# Patient Record
Sex: Female | Born: 1940
Health system: Southern US, Community
[De-identification: ages and names within clinical notes are randomized; demographics above are authoritative.]

## PROBLEM LIST (undated history)

## (undated) DIAGNOSIS — K219 Gastro-esophageal reflux disease without esophagitis: Secondary | ICD-10-CM

## (undated) DIAGNOSIS — I10 Essential (primary) hypertension: Secondary | ICD-10-CM

## (undated) DIAGNOSIS — M316 Other giant cell arteritis: Secondary | ICD-10-CM

## (undated) DIAGNOSIS — E785 Hyperlipidemia, unspecified: Secondary | ICD-10-CM

## (undated) DIAGNOSIS — B029 Zoster without complications: Secondary | ICD-10-CM

## (undated) DIAGNOSIS — M1990 Unspecified osteoarthritis, unspecified site: Secondary | ICD-10-CM

## (undated) DIAGNOSIS — M810 Age-related osteoporosis without current pathological fracture: Secondary | ICD-10-CM

## (undated) HISTORY — DX: Zoster without complications: B02.9

## (undated) HISTORY — DX: Age-related osteoporosis without current pathological fracture: M81.0

## (undated) HISTORY — DX: Gastro-esophageal reflux disease without esophagitis: K21.9

## (undated) HISTORY — DX: Other giant cell arteritis: M31.6

## (undated) HISTORY — PX: BREAST EXCISIONAL BIOPSY: SUR124

---

## 1976-04-19 HISTORY — PX: ABDOMINAL HYSTERECTOMY: SHX81

## 2007-04-20 HISTORY — PX: BREAST EXCISIONAL BIOPSY: SUR124

## 2011-09-02 ENCOUNTER — Emergency Department (HOSPITAL_COMMUNITY)
Admission: EM | Admit: 2011-09-02 | Discharge: 2011-09-02 | Disposition: A | Discharge status: Home / Self Care | Payer: Medicare Other | Attending: Emergency Medicine | Admitting: Emergency Medicine

## 2011-09-02 ENCOUNTER — Encounter (HOSPITAL_COMMUNITY): Payer: Self-pay | Admitting: Physical Medicine and Rehabilitation

## 2011-09-02 DIAGNOSIS — Z8739 Personal history of other diseases of the musculoskeletal system and connective tissue: Secondary | ICD-10-CM | POA: Insufficient documentation

## 2011-09-02 DIAGNOSIS — I10 Essential (primary) hypertension: Secondary | ICD-10-CM | POA: Insufficient documentation

## 2011-09-02 DIAGNOSIS — I160 Hypertensive urgency: Secondary | ICD-10-CM

## 2011-09-02 DIAGNOSIS — E785 Hyperlipidemia, unspecified: Secondary | ICD-10-CM | POA: Insufficient documentation

## 2011-09-02 DIAGNOSIS — R51 Headache: Secondary | ICD-10-CM

## 2011-09-02 DIAGNOSIS — Z79899 Other long term (current) drug therapy: Secondary | ICD-10-CM | POA: Insufficient documentation

## 2011-09-02 HISTORY — DX: Essential (primary) hypertension: I10

## 2011-09-02 HISTORY — DX: Hyperlipidemia, unspecified: E78.5

## 2011-09-02 HISTORY — DX: Unspecified osteoarthritis, unspecified site: M19.90

## 2011-09-02 LAB — BASIC METABOLIC PANEL
Calcium: 9.7 mg/dL (ref 8.4–10.5)
Creatinine, Ser: 0.98 mg/dL (ref 0.50–1.10)
GFR calc non Af Amer: 57 mL/min — ABNORMAL LOW (ref >90)
Glucose, Bld: 87 mg/dL (ref 70–99)
Sodium: 142 mEq/L (ref 135–145)

## 2011-09-02 LAB — URINALYSIS, ROUTINE W REFLEX MICROSCOPIC
Ketones, ur: NEGATIVE mg/dL
Leukocytes, UA: NEGATIVE
Nitrite: NEGATIVE
Protein, ur: NEGATIVE mg/dL
Urobilinogen, UA: 0.2 mg/dL (ref 0.0–1.0)

## 2011-09-02 LAB — DIFFERENTIAL
Basophils Absolute: 0 10*3/uL (ref 0.0–0.1)
Basophils Relative: 1 % (ref 0–1)
Eosinophils Absolute: 0.1 10*3/uL (ref 0.0–0.7)
Lymphs Abs: 2 10*3/uL (ref 0.7–4.0)
Neutrophils Relative %: 58 % (ref 43–77)

## 2011-09-02 LAB — CBC
MCH: 28.8 pg (ref 26.0–34.0)
Platelets: 238 10*3/uL (ref 150–400)
RBC: 4.06 MIL/uL (ref 3.87–5.11)
RDW: 13.6 % (ref 11.5–15.5)

## 2011-09-02 LAB — URINE MICROSCOPIC-ADD ON

## 2011-09-02 MED ORDER — AMLODIPINE BESYLATE 5 MG PO TABS: 10.0000 mg | ORAL_TABLET | Freq: Every day | ORAL | Status: AC

## 2011-09-02 NOTE — ED Notes (Signed)
Denies dizziness with ambulation to restroom

## 2011-09-02 NOTE — ED Notes (Signed)
Pt presents to department for evaluation of hypertension, headache and dizziness. Onset yesterday. BP of 171/90 at home. Pt states "soreness" to head and dizzy feeling. 2/10 pain at the time. She is conscious alert and oriented x4. Ambulatory to triage. Denies chest pain. Respirations unlabored. Skin warm and dry. No signs of acute distress at present.

## 2011-09-02 NOTE — ED Provider Notes (Signed)
History     CSN: [id number]  Arrival date & time 09/02/11  1010   First MD Initiated Contact with Patient 09/02/11 1024      Chief Complaint  Patient presents with  . Hypertension     HPI The patient presents with concerns of hypertension, headache, dizziness.  She notes that the latter 2 complaints both began yesterday, gradually.  Since onset she has had mild symptoms, not appreciably changed by anything.  Yesterday, as her symptoms began she took her blood pressure and found it to be elevated.  He has been compliant with her medication, and notes no recent changes in dosages or frequency.  After her symptoms persisted for most 24 hours, she became concerned.  She denies any confusion, ataxia, weakness, discoordination, chest pain, dyspnea. Past Medical History  Diagnosis Date  . Hyperlipemia   . Hypertension   . Arthritis     No past surgical history on file.  No family history on file.  History  Substance Use Topics  . Smoking status: Never Smoker   . Smokeless tobacco: Not on file  . Alcohol Use: No    OB History    Grav Para Term Preterm Abortions TAB SAB Ect Mult Living                  Review of Systems  Constitutional:       HPI  HENT:       HPI otherwise negative  Eyes: Negative.   Respiratory:       HPI, otherwise negative  Cardiovascular:       HPI, otherwise nmegative  Gastrointestinal: Negative for vomiting.  Genitourinary:       HPI, otherwise negative  Musculoskeletal:       HPI, otherwise negative  Skin: Negative.   Neurological: Negative for syncope.    Allergies  Review of patient's allergies indicates no known allergies.  Home Medications   Current Outpatient Rx  Name Route Sig Dispense Refill  . AMLODIPINE BESYLATE 5 MG PO TABS Oral Take 5 mg by mouth daily.    . ASPIRIN EC 81 MG PO TBEC Oral Take 81 mg by mouth daily.    Marland Kitchen LOSARTAN POTASSIUM 100 MG PO TABS Oral Take 100 mg by mouth daily.    Marland Kitchen OMEPRAZOLE 20 MG PO CPDR Oral  Take 40 mg by mouth daily.    Marland Kitchen PRAVASTATIN SODIUM 40 MG PO TABS Oral Take 40 mg by mouth daily.    Marland Kitchen PREDNISONE 5 MG PO TABS Oral Take 5 mg by mouth daily.      BP 163/97  Pulse 72  Temp(Src) 98 F (36.7 C) (Oral)  Resp 18  SpO2 99%  Physical Exam  Nursing note and vitals reviewed. Constitutional: She is oriented to person, place, and time. She appears well-developed and well-nourished. No distress.  HENT:  Head: Normocephalic and atraumatic.  Eyes: Conjunctivae and EOM are normal.  Cardiovascular: Normal rate and regular rhythm.   Pulmonary/Chest: Effort normal and breath sounds normal. No stridor. No respiratory distress.  Abdominal: She exhibits no distension.  Musculoskeletal: She exhibits no edema.  Neurological: She is alert and oriented to person, place, and time. She has normal strength. She displays no atrophy and no tremor. No cranial nerve deficit. She exhibits normal muscle tone. She displays no seizure activity. Coordination and gait normal.  Skin: Skin is warm and dry.  Psychiatric: She has a normal mood and affect.    ED Course  Procedures (including critical  care time)  Labs Reviewed  BASIC METABOLIC PANEL - Abnormal; Notable for the following:    Potassium 3.4 (*)    GFR calc non Af Amer 57 (*)    GFR calc Af Amer 66 (*)    All other components within normal limits  CBC - Abnormal; Notable for the following:    Hemoglobin 11.7 (*)    HCT 35.6 (*)    All other components within normal limits  URINALYSIS, ROUTINE W REFLEX MICROSCOPIC - Abnormal; Notable for the following:    Hgb urine dipstick TRACE (*)    All other components within normal limits  DIFFERENTIAL  URINE MICROSCOPIC-ADD ON   No results found.   No diagnosis found.  Cardiac: 60 sr- normal  Pulse ox 100% ra- normal   Date: 09/02/2011  Rate: 64  Rhythm: normal sinus rhythm  QRS Axis: left  Intervals: normal  ST/T Wave abnormalities: nonspecific T wave changes  Conduction  Disutrbances:none  Narrative Interpretation:   Old EKG Reviewed: none available ABNORMAL   MDM  This generally well-appearing elderly female presents with concerns of hypertension, headache, fatigue.  On my exam the patient is in no distress, with unremarkable vital signs.  The patient has no notable physical exam findings.  Given the patient's description of hypertension requiring multiple medications, there's concern for hypertensive urgency.  Patient's labs are most notable for mild hematuria and renal dysfunction, though no significant anomalies are present.  The patient was made aware of all findings, the need for close management of her blood pressure by her primary care physician.  She was discharged in stable condition.    Gerhard Munch, MD 09/02/11 1227

## 2011-09-02 NOTE — Discharge Instructions (Signed)
Arterial Hypertension Arterial hypertension (high blood pressure) is a condition of elevated pressure in your blood vessels. Hypertension over a long period of time is a risk factor for strokes, heart attacks, and heart failure. It is also the leading cause of kidney (renal) failure.  CAUSES   In Adults -- Over 90% of all hypertension has no known cause. This is called essential or primary hypertension. In the other 10% of people with hypertension, the increase in blood pressure is caused by another disorder. This is called secondary hypertension. Important causes of secondary hypertension are:   Heavy alcohol use.   Obstructive sleep apnea.   Hyperaldosterosim (Conn's syndrome).   Steroid use.   Chronic kidney failure.   Hyperparathyroidism.   Medications.   Renal artery stenosis.   Pheochromocytoma.   Cushing's disease.   Coarctation of the aorta.   Scleroderma renal crisis.   Licorice (in excessive amounts).   Drugs (cocaine, methamphetamine).  Your caregiver can explain any items above that apply to you.  In Children -- Secondary hypertension is more common and should always be considered.   Pregnancy -- Few women of childbearing age have high blood pressure. However, up to 10% of them develop hypertension of pregnancy. Generally, this will not harm the woman. It may be a sign of 3 complications of pregnancy: preeclampsia, HELLP syndrome, and eclampsia. Follow up and control with medication is necessary.  SYMPTOMS   This condition normally does not produce any noticeable symptoms. It is usually found during a routine exam.   Malignant hypertension is a late problem of high blood pressure. It may have the following symptoms:   Headaches.   Blurred vision.   End-organ damage (this means your kidneys, heart, lungs, and other organs are being damaged).   Stressful situations can increase the blood pressure. If a person with normal blood pressure has their blood  pressure go up while being seen by their caregiver, this is often termed "white coat hypertension." Its importance is not known. It may be related with eventually developing hypertension or complications of hypertension.   Hypertension is often confused with mental tension, stress, and anxiety.  DIAGNOSIS  The diagnosis is made by 3 separate blood pressure measurements. They are taken at least 1 week apart from each other. If there is organ damage from hypertension, the diagnosis may be made without repeat measurements. Hypertension is usually identified by having blood pressure readings:  Above 140/90 mmHg measured in both arms, at 3 separate times, over a couple weeks.   Over 130/80 mmHg should be considered a risk factor and may require treatment in patients with diabetes.  Blood pressure readings over 120/80 mmHg are called "pre-hypertension" even in non-diabetic patients. To get a true blood pressure measurement, use the following guidelines. Be aware of the factors that can alter blood pressure readings.  Take measurements at least 1 hour after caffeine.   Take measurements 30 minutes after smoking and without any stress. This is another reason to quit smoking - it raises your blood pressure.   Use a proper cuff size. Ask your caregiver if you are not sure about your cuff size.   Most home blood pressure cuffs are automatic. They will measure systolic and diastolic pressures. The systolic pressure is the pressure reading at the start of sounds. Diastolic pressure is the pressure at which the sounds disappear. If you are elderly, measure pressures in multiple postures. Try sitting, lying or standing.   Sit at rest for a minimum of   5 minutes before taking measurements.   You should not be on any medications like decongestants. These are found in many cold medications.   Record your blood pressure readings and review them with your caregiver.  If you have hypertension:  Your caregiver  may do tests to be sure you do not have secondary hypertension (see "causes" above).   Your caregiver may also look for signs of metabolic syndrome. This is also called Syndrome X or Insulin Resistance Syndrome. You may have this syndrome if you have type 2 diabetes, abdominal obesity, and abnormal blood lipids in addition to hypertension.   Your caregiver will take your medical and family history and perform a physical exam.   Diagnostic tests may include blood tests (for glucose, cholesterol, potassium, and kidney function), a urinalysis, or an EKG. Other tests may also be necessary depending on your condition.  PREVENTION  There are important lifestyle issues that you can adopt to reduce your chance of developing hypertension:  Maintain a normal weight.   Limit the amount of salt (sodium) in your diet.   Exercise often.   Limit alcohol intake.   Get enough potassium in your diet. Discuss specific advice with your caregiver.   Follow a DASH diet (dietary approaches to stop hypertension). This diet is rich in fruits, vegetables, and low-fat dairy products, and avoids certain fats.  PROGNOSIS  Essential hypertension cannot be cured. Lifestyle changes and medical treatment can lower blood pressure and reduce complications. The prognosis of secondary hypertension depends on the underlying cause. Many people whose hypertension is controlled with medicine or lifestyle changes can live a normal, healthy life.  RISKS AND COMPLICATIONS  While high blood pressure alone is not an illness, it often requires treatment due to its short- and long-term effects on many organs. Hypertension increases your risk for:  CVAs or strokes (cerebrovascular accident).   Heart failure due to chronically high blood pressure (hypertensive cardiomyopathy).   Heart attack (myocardial infarction).   Damage to the retina (hypertensive retinopathy).   Kidney failure (hypertensive nephropathy).  Your caregiver can  explain list items above that apply to you. Treatment of hypertension can significantly reduce the risk of complications. TREATMENT   For overweight patients, weight loss and regular exercise are recommended. Physical fitness lowers blood pressure.   Mild hypertension is usually treated with diet and exercise. A diet rich in fruits and vegetables, fat-free dairy products, and foods low in fat and salt (sodium) can help lower blood pressure. Decreasing salt intake decreases blood pressure in a 1/3 of people.   Stop smoking if you are a smoker.  The steps above are highly effective in reducing blood pressure. While these actions are easy to suggest, they are difficult to achieve. Most patients with moderate or severe hypertension end up requiring medications to bring their blood pressure down to a normal level. There are several classes of medications for treatment. Blood pressure pills (antihypertensives) will lower blood pressure by their different actions. Lowering the blood pressure by 10 mmHg may decrease the risk of complications by as much as 25%. The goal of treatment is effective blood pressure control. This will reduce your risk for complications. Your caregiver will help you determine the best treatment for you according to your lifestyle. What is excellent treatment for one person, may not be for you. HOME CARE INSTRUCTIONS   Do not smoke.   Follow the lifestyle changes outlined in the "Prevention" section.   If you are on medications, follow the directions   carefully. Blood pressure medications must be taken as prescribed. Skipping doses reduces their benefit. It also puts you at risk for problems.   Follow up with your caregiver, as directed.   If you are asked to monitor your blood pressure at home, follow the guidelines in the "Diagnosis" section above.  SEEK MEDICAL CARE IF:   You think you are having medication side effects.   You have recurrent headaches or lightheadedness.     You have swelling in your ankles.   You have trouble with your vision.  SEEK IMMEDIATE MEDICAL CARE IF:   You have sudden onset of chest pain or pressure, difficulty breathing, or other symptoms of a heart attack.   You have a severe headache.   You have symptoms of a stroke (such as sudden weakness, difficulty speaking, difficulty walking).  MAKE SURE YOU:   Understand these instructions.   Will watch your condition.   Will get help right away if you are not doing well or get worse.  Document Released: 04/05/2005 Document Revised: 03/25/2011 Document Reviewed: 11/03/2006 [employer] Patient Information 2012 [employer]. 

## 2012-07-27 ENCOUNTER — Other Ambulatory Visit (HOSPITAL_COMMUNITY): Payer: Self-pay | Admitting: Internal Medicine

## 2012-07-27 DIAGNOSIS — N882 Stricture and stenosis of cervix uteri: Secondary | ICD-10-CM

## 2012-07-28 ENCOUNTER — Other Ambulatory Visit: Payer: Self-pay

## 2012-07-28 DIAGNOSIS — Z1231 Encounter for screening mammogram for malignant neoplasm of breast: Secondary | ICD-10-CM

## 2012-08-03 ENCOUNTER — Ambulatory Visit (HOSPITAL_COMMUNITY)
Admission: RE | Admit: 2012-08-03 | Discharge: 2012-08-03 | Disposition: A | Discharge status: Home / Self Care | Payer: Medicare Other | Source: Ambulatory Visit | Attending: Cardiovascular Disease | Admitting: Cardiovascular Disease

## 2012-08-03 DIAGNOSIS — N882 Stricture and stenosis of cervix uteri: Secondary | ICD-10-CM | POA: Insufficient documentation

## 2012-08-03 NOTE — Progress Notes (Signed)
Carotid Duplex Imaging Complete Terisha Losasso 

## 2012-08-22 ENCOUNTER — Ambulatory Visit
Admission: RE | Admit: 2012-08-22 | Discharge: 2012-08-22 | Disposition: A | Discharge status: Home / Self Care | Payer: Medicare Other | Source: Ambulatory Visit

## 2012-08-22 DIAGNOSIS — Z1231 Encounter for screening mammogram for malignant neoplasm of breast: Secondary | ICD-10-CM

## 2013-10-16 ENCOUNTER — Other Ambulatory Visit: Payer: Self-pay

## 2013-10-16 ENCOUNTER — Other Ambulatory Visit: Payer: Self-pay | Admitting: Internal Medicine

## 2013-10-16 DIAGNOSIS — E2839 Other primary ovarian failure: Secondary | ICD-10-CM

## 2013-10-16 DIAGNOSIS — Z1231 Encounter for screening mammogram for malignant neoplasm of breast: Secondary | ICD-10-CM

## 2013-10-31 ENCOUNTER — Ambulatory Visit
Admission: RE | Admit: 2013-10-31 | Discharge: 2013-10-31 | Disposition: A | Discharge status: Home / Self Care | Payer: Medicare Other | Source: Ambulatory Visit

## 2013-10-31 DIAGNOSIS — Z1231 Encounter for screening mammogram for malignant neoplasm of breast: Secondary | ICD-10-CM

## 2014-04-01 ENCOUNTER — Emergency Department (HOSPITAL_COMMUNITY): Payer: Medicare Other

## 2014-04-01 ENCOUNTER — Encounter (HOSPITAL_COMMUNITY): Payer: Self-pay | Admitting: Emergency Medicine

## 2014-04-01 ENCOUNTER — Emergency Department (HOSPITAL_COMMUNITY)
Admission: EM | Admit: 2014-04-01 | Discharge: 2014-04-01 | Disposition: A | Discharge status: Home / Self Care | Payer: Medicare Other | Attending: Emergency Medicine | Admitting: Emergency Medicine

## 2014-04-01 DIAGNOSIS — M1990 Unspecified osteoarthritis, unspecified site: Secondary | ICD-10-CM | POA: Diagnosis not present

## 2014-04-01 DIAGNOSIS — E785 Hyperlipidemia, unspecified: Secondary | ICD-10-CM | POA: Insufficient documentation

## 2014-04-01 DIAGNOSIS — R51 Headache: Secondary | ICD-10-CM

## 2014-04-01 DIAGNOSIS — R42 Dizziness and giddiness: Secondary | ICD-10-CM

## 2014-04-01 DIAGNOSIS — Z79899 Other long term (current) drug therapy: Secondary | ICD-10-CM | POA: Diagnosis not present

## 2014-04-01 DIAGNOSIS — Z7952 Long term (current) use of systemic steroids: Secondary | ICD-10-CM | POA: Insufficient documentation

## 2014-04-01 DIAGNOSIS — Z7982 Long term (current) use of aspirin: Secondary | ICD-10-CM | POA: Diagnosis not present

## 2014-04-01 DIAGNOSIS — I10 Essential (primary) hypertension: Secondary | ICD-10-CM | POA: Diagnosis not present

## 2014-04-01 LAB — PROTIME-INR
INR: 1.05 (ref 0.00–1.49)
PROTHROMBIN TIME: 13.9 s (ref 11.6–15.2)

## 2014-04-01 LAB — CBC WITH DIFFERENTIAL/PLATELET
BASOS ABS: 0 10*3/uL (ref 0.0–0.1)
Basophils Relative: 0 % (ref 0–1)
Eosinophils Absolute: 0.1 10*3/uL (ref 0.0–0.7)
Eosinophils Relative: 2 % (ref 0–5)
HEMATOCRIT: 35.2 % — AB (ref 36.0–46.0)
HEMOGLOBIN: 11.3 g/dL — AB (ref 12.0–15.0)
LYMPHS ABS: 1.9 10*3/uL (ref 0.7–4.0)
LYMPHS PCT: 32 % (ref 12–46)
MCH: 27.8 pg (ref 26.0–34.0)
MCHC: 32.1 g/dL (ref 30.0–36.0)
MCV: 86.7 fL (ref 78.0–100.0)
MONO ABS: 0.4 10*3/uL (ref 0.1–1.0)
MONOS PCT: 8 % (ref 3–12)
NEUTROS ABS: 3.5 10*3/uL (ref 1.7–7.7)
Neutrophils Relative %: 58 % (ref 43–77)
Platelets: 248 10*3/uL (ref 150–400)
RBC: 4.06 MIL/uL (ref 3.87–5.11)
RDW: 13.9 % (ref 11.5–15.5)
WBC: 5.9 10*3/uL (ref 4.0–10.5)

## 2014-04-01 LAB — I-STAT CHEM 8, ED
BUN: 14 mg/dL (ref 6–23)
CALCIUM ION: 1.23 mmol/L (ref 1.13–1.30)
CHLORIDE: 106 meq/L (ref 96–112)
Creatinine, Ser: 1.1 mg/dL (ref 0.50–1.10)
Glucose, Bld: 119 mg/dL — ABNORMAL HIGH (ref 70–99)
HCT: 37 % (ref 36.0–46.0)
Hemoglobin: 12.6 g/dL (ref 12.0–15.0)
Potassium: 3.4 mEq/L — ABNORMAL LOW (ref 3.7–5.3)
Sodium: 142 mEq/L (ref 137–147)
TCO2: 25 mmol/L (ref 0–100)

## 2014-04-01 MED ORDER — KETOROLAC TROMETHAMINE 30 MG/ML IJ SOLN
30.0000 mg | Freq: Once | INTRAMUSCULAR | Status: AC
  Administered 2014-04-01: 30 mg via INTRAVENOUS
  Filled 2014-04-01: qty 1

## 2014-04-01 MED ORDER — MECLIZINE HCL 25 MG PO TABS: 25.0000 mg | ORAL_TABLET | Freq: Three times a day (TID) | ORAL | Status: DC | PRN

## 2014-04-01 NOTE — ED Notes (Signed)
Dr. Kirkpatrick at bedside 

## 2014-04-01 NOTE — ED Notes (Signed)
Pt denies dizziness at this time, informed that she is not to drive when feeling dizzy.

## 2014-04-01 NOTE — Consult Note (Signed)
Neurology Consultation Reason for Consult: Dizziness Referring Physician: Stark Jock, D  CC: Dizziness  History is obtained from: Patient  HPI: Lori Sanchez is a 73 y.o. female who presents to the ER with a sensation of "dizziness" that started while lying down. She states that she turned over on to her stomach and she began to feel a sensation of movement. She states that this happens frequently and this time was no different. Following this, she was wondering about her blood pressure and states that she began to feel like her voice was "low." Since that time, a sensation of movement has much improved and has mostly resolved at this point.  She also describes a headache which is on the right side of her head and feels like a tenderness. She gets these from time to time, and this is not an unusual headache for her.   ROS: A 14 point ROS was performed and is negative except as noted in the HPI.   Past Medical History  Diagnosis Date  . Hyperlipemia   . Hypertension   . Arthritis     Family History: No history of similar  Social History: Tob: Denies  Exam: Current vital signs: BP 136/84 mmHg  Pulse 55  Temp(Src) 97.9 F (36.6 C) (Oral)  Resp 16  Ht 5\' 3"  (1.6 m)  Wt 68.04 kg (150 lb)  BMI 26.58 kg/m2  SpO2 99% Vital signs in last 24 hours: Temp:  [97.9 F (36.6 C)] 97.9 F (36.6 C) (12/14 0139) Pulse Rate:  [55-67] 55 (12/14 0216) Resp:  [14-16] 16 (12/14 0216) BP: (136-163)/(84-94) 136/84 mmHg (12/14 0216) SpO2:  [99 %-100 %] 99 % (12/14 0216) Weight:  [68.04 kg (150 lb)] 68.04 kg (150 lb) (12/14 0139)   Physical Exam  Constitutional: Appears well-developed and well-nourished.  Psych: Affect appropriate to situation Eyes: No scleral injection HENT: No OP obstrucion Head: Normocephalic.  Cardiovascular: Normal rate and regular rhythm.  Respiratory: Effort normal and nonlabored breathing GI: Soft.  No distension. There is no tenderness.  Skin:  WDI  Neuro: Mental Status: Patient is awake, alert, oriented to person, place, month, year, and situation. Patient is able to give a clear and coherent history. No signs of aphasia or neglect Cranial Nerves: II: Visual Fields are full. Pupils are equal, round, and reactive to light.   III,IV, VI: EOMI without ptosis or diploplia.  V: Facial sensation is symmetric to temperature VII: Facial movement is symmetric.  VIII: hearing is intact to voice X: Uvula elevates symmetrically XI: Shoulder shrug is symmetric. XII: tongue is midline without atrophy or fasciculations.  Motor: Tone is normal. Bulk is normal. 5/5 strength was present in all four extremities.  Sensory: Sensation is symmetric to light touch and temperature in the arms and legs. Deep Tendon Reflexes: 2+ and symmetric in the biceps and patellae.  Cerebellar: FNF and HKS are intact bilaterally         I have reviewed labs in epic and the results pertinent to this consultation are: Creatinine normal  I have reviewed the images obtained: CT head-no acute findings  Impression: 73 year old female with a description most consistent with a description most consistent with vertigo. The repeated episodes in the past identical to this episode as well as the characteristic precipitator, turning over in bed, very consistent with this. Her subsequent sensation is very nonspecific and I suspect there may be an anxiety component to this.  Recommendations: 1) Toradol for headache 2) no further recommendations at this time.  Roland Rack, MD [facility] [phone]  If 7pm- 7am, please page neurology on call as listed in [place].

## 2014-04-01 NOTE — ED Notes (Signed)
Pt reports lying in bed around midnight and began feeling dizzy while lying flat. Pt c/o headache x 2 days. Recently had blood pressure medication change. Reports blood pressure was low on PCP visit and a dose of medication was cut in half. Over the past two days, pt has had a dull headache. Pt reports feeling more weak on R side; R leg drift noted with decreased sensation. Pt also c/o "having a hard time getting my words together." speech is slow, clear.

## 2014-04-01 NOTE — ED Notes (Signed)
Pt. reports dizziness/lightheaded onset this evening while lying on bed , alert and oriented at arrival , speech clear / no facial asymmetry , no arm drift.

## 2014-04-01 NOTE — ED Provider Notes (Signed)
CSN: [id number]     Arrival date & time 04/01/14  0131 History  This chart was scribed for Veryl Speak, MD by Rayfield Citizen, ED Scribe. This patient was seen in room A08C/A08C and the patient's care was started at 2:45 AM.    Chief Complaint  Patient presents with  . Dizziness   Patient is a 73 y.o. female presenting with dizziness. The history is provided by the patient. No language interpreter was used.  Dizziness Description: pt describes as "a sense of motion, movement" Severity:  Mild Duration:  3 hours Timing:  Constant Progression:  Unchanged Chronicity:  New Context comment:  Lying at rest Relieved by:  None tried Worsened by:  Nothing tried Ineffective treatments:  None tried Associated symptoms: no nausea, no palpitations, no vision changes and no weakness      HPI Comments: Lori Sanchez is a 73 y.o. female with past medical history of HTN, HLD who presents to the Emergency Department complaining of dizziness. Patient reports that she was lying in bed around midnight and began to feel dizzy; she reports a sensation of movement, as though she were "swimming." She feels this sensation at present. She also notes that she feels her speech is abnormal: she believes that her speech is "low, weak" when she is a "strong talker" at baseline. She denies weakness, vision changes, nausea, chest pain, "racing heart" sensations. She denies prior experience with similar symptoms.   Patient reports that she recently had a blood pressure medication change; her PCP told her that her blood pressure was low and cut her dose in half.  Past Medical History  Diagnosis Date  . Hyperlipemia   . Hypertension   . Arthritis    History reviewed. No pertinent past surgical history. No family history on file. History  Substance Use Topics  . Smoking status: Never Smoker   . Smokeless tobacco: Not on file  . Alcohol Use: No   OB History    No data available     Review of Systems   Cardiovascular: Negative for palpitations.  Gastrointestinal: Negative for nausea.  Neurological: Positive for dizziness.    A complete 10 system review of systems was obtained and all systems are negative except as noted in the HPI and PMH.   Allergies  Review of patient's allergies indicates no known allergies.  Home Medications   Prior to Admission medications   Medication Sig Start Date End Date Taking? Authorizing Provider  amLODipine (NORVASC) 5 MG tablet Take 2 tablets (10 mg total) by mouth daily. 09/02/11   [name], MD  aspirin EC 81 MG tablet Take 81 mg by mouth daily.    Historical Provider, MD  losartan (COZAAR) 100 MG tablet Take 100 mg by mouth daily.    Historical Provider, MD  omeprazole (PRILOSEC) 20 MG capsule Take 40 mg by mouth daily.    Historical Provider, MD  pravastatin (PRAVACHOL) 40 MG tablet Take 40 mg by mouth daily.    Historical Provider, MD  predniSONE (DELTASONE) 5 MG tablet Take 5 mg by mouth daily.    Historical Provider, MD   BP 136/84 mmHg  Pulse 55  Temp(Src) 97.9 F (36.6 C) (Oral)  Resp 16  Ht 5\' 3"  (1.6 m)  Wt 150 lb (68.04 kg)  BMI 26.58 kg/m2  SpO2 99% Physical Exam  Constitutional: She is oriented to person, place, and time. She appears well-developed and well-nourished.  HENT:  Head: Normocephalic and atraumatic.  Neck: No tracheal deviation present.  Cardiovascular: Normal rate, regular rhythm and normal heart sounds.  Exam reveals no gallop and no friction rub.   No murmur heard. Pulmonary/Chest: Effort normal and breath sounds normal. No respiratory distress. She has no wheezes. She has no rales.  Abdominal: Soft. Bowel sounds are normal. There is no tenderness. There is no rebound and no guarding.  Neurological: She is alert and oriented to person, place, and time.  Skin: Skin is warm and dry.  Psychiatric: She has a normal mood and affect. Her behavior is normal.  Nursing note and vitals reviewed.   ED Course   Procedures   DIAGNOSTIC STUDIES: Oxygen Saturation is 99% on RA, normal by my interpretation.    COORDINATION OF CARE: 2:50 AM Discussed treatment plan with pt at bedside and pt agreed to plan.   Labs Review Labs Reviewed - No data to display  Imaging Review No results found.   EKG Interpretation   Date/Time:  Monday April 01 2014 03:15:19 EST Ventricular Rate:  59 PR Interval:  158 QRS Duration: 81 QT Interval:  433 QTC Calculation: 429 R Axis:   -4 Text Interpretation:  Sinus rhythm Low voltage, precordial leads LVH by  voltage Confirmed by DELOS  MD, Curtez Brallier ([id number]) on 04/01/2014 4:33:13 AM      MDM   Final diagnoses:  None    Patient presents with complaints of dizziness, headache that started at approximately midnight this evening. She is reporting difficulty speaking, however she appears to be speaking without difficulty here. Her speech is fluent and comprehensible and she is neurologically intact. CT scan of the head is unremarkable and laboratory studies are unremarkable. Patient was also evaluated by Dr. Leonel Ramsay from neurology and we are in agreement that her symptoms sound vertiginous in nature. We will prescribe meclizine and I believe the patient is appropriate for discharge. She is to follow-up with her primary Dr. and return to the ER if she develops any new and concerning symptoms.   I personally performed the services described in this documentation, which was scribed in my presence. The recorded information has been reviewed and is accurate.      Veryl Speak, MD 04/01/14 [phone]

## 2014-04-01 NOTE — ED Notes (Signed)
Dr. Delo at bedside. 

## 2014-04-01 NOTE — Discharge Instructions (Signed)
Meclizine as prescribed as needed for dizziness.  Return to the emergency department if you develop severe headache, fever with stiff neck, or other new and concerning symptoms.   Dizziness Dizziness is a common problem. It is a feeling of unsteadiness or light-headedness. You may feel like you are about to faint. Dizziness can lead to injury if you stumble or fall. A person of any age group can suffer from dizziness, but dizziness is more common in older adults. CAUSES  Dizziness can be caused by many different things, including:  Middle ear problems.  Standing for too long.  Infections.  An allergic reaction.  Aging.  An emotional response to something, such as the sight of blood.  Side effects of medicines.  Tiredness.  Problems with circulation or blood pressure.  Excessive use of alcohol or medicines, or illegal drug use.  Breathing too fast (hyperventilation).  An irregular heart rhythm (arrhythmia).  A low red blood cell count (anemia).  Pregnancy.  Vomiting, diarrhea, fever, or other illnesses that cause body fluid loss (dehydration).  Diseases or conditions such as Parkinson's disease, high blood pressure (hypertension), diabetes, and thyroid problems.  Exposure to extreme heat. DIAGNOSIS  Your health care provider will ask about your symptoms, perform a physical exam, and perform an electrocardiogram (ECG) to record the electrical activity of your heart. Your health care provider may also perform other heart or blood tests to determine the cause of your dizziness. These may include:  Transthoracic echocardiogram (TTE). During echocardiography, sound waves are used to evaluate how blood flows through your heart.  Transesophageal echocardiogram (TEE).  Cardiac monitoring. This allows your health care provider to monitor your heart rate and rhythm in real time.  Holter monitor. This is a portable device that records your heartbeat and can help diagnose heart  arrhythmias. It allows your health care provider to track your heart activity for several days if needed.  Stress tests by exercise or by giving medicine that makes the heart beat faster. TREATMENT  Treatment of dizziness depends on the cause of your symptoms and can vary greatly. HOME CARE INSTRUCTIONS   Drink enough fluids to keep your urine clear or pale yellow. This is especially important in very hot weather. In older adults, it is also important in cold weather.  Take your medicine exactly as directed if your dizziness is caused by medicines. When taking blood pressure medicines, it is especially important to get up slowly.  Rise slowly from chairs and steady yourself until you feel okay.  In the morning, first sit up on the side of the bed. When you feel okay, stand slowly while holding onto something until you know your balance is fine.  Move your legs often if you need to stand in one place for a long time. Tighten and relax your muscles in your legs while standing.  Have someone stay with you for 1-2 days if dizziness continues to be a problem. Do this until you feel you are well enough to stay alone. Have the person call your health care provider if he or she notices changes in you that are concerning.  Do not drive or use heavy machinery if you feel dizzy.  Do not drink alcohol. SEEK IMMEDIATE MEDICAL CARE IF:   Your dizziness or light-headedness gets worse.  You feel nauseous or vomit.  You have problems talking, walking, or using your arms, hands, or legs.  You feel weak.  You are not thinking clearly or you have trouble forming sentences.  It may take a friend or family member to notice this.  You have chest pain, abdominal pain, shortness of breath, or sweating.  Your vision changes.  You notice any bleeding.  You have side effects from medicine that seems to be getting worse rather than better. MAKE SURE YOU:   Understand these instructions.  Will watch  your condition.  Will get help right away if you are not doing well or get worse. Document Released: 09/29/2000 Document Revised: 04/10/2013 Document Reviewed: 10/23/2010 [facility] Patient Information 2015 [place], [place]. This information is not intended to replace advice given to you by your health care provider. Make sure you discuss any questions you have with your health care provider.  Benign Positional Vertigo Vertigo means you feel like you or your surroundings are moving when they are not. Benign positional vertigo is the most common form of vertigo. Benign means that the cause of your condition is not serious. Benign positional vertigo is more common in older adults. CAUSES  Benign positional vertigo is the result of an upset in the labyrinth system. This is an area in the middle ear that helps control your balance. This may be caused by a viral infection, head injury, or repetitive motion. However, often no specific cause is found. SYMPTOMS  Symptoms of benign positional vertigo occur when you move your head or eyes in different directions. Some of the symptoms may include:  Loss of balance and falls.  Vomiting.  Blurred vision.  Dizziness.  Nausea.  Involuntary eye movements (nystagmus). DIAGNOSIS  Benign positional vertigo is usually diagnosed by physical exam. If the specific cause of your benign positional vertigo is unknown, your caregiver may perform imaging tests, such as magnetic resonance imaging (MRI) or computed tomography (CT). TREATMENT  Your caregiver may recommend movements or procedures to correct the benign positional vertigo. Medicines such as meclizine, benzodiazepines, and medicines for nausea may be used to treat your symptoms. In rare cases, if your symptoms are caused by certain conditions that affect the inner ear, you may need surgery. HOME CARE INSTRUCTIONS   Follow your caregiver's instructions.  Move slowly. Do not make sudden body or head  movements.  Avoid driving.  Avoid operating heavy machinery.  Avoid performing any tasks that would be dangerous to you or others during a vertigo episode.  Drink enough fluids to keep your urine clear or pale yellow. SEEK IMMEDIATE MEDICAL CARE IF:   You develop problems with walking, weakness, numbness, or using your arms, hands, or legs.  You have difficulty speaking.  You develop severe headaches.  Your nausea or vomiting continues or gets worse.  You develop visual changes.  Your family or friends notice any behavioral changes.  Your condition gets worse.  You have a fever.  You develop a stiff neck or sensitivity to light. MAKE SURE YOU:   Understand these instructions.  Will watch your condition.  Will get help right away if you are not doing well or get worse. Document Released: 01/11/2006 Document Revised: 06/28/2011 Document Reviewed: 12/24/2010 [facility] Patient Information 2015 [place], [place]. This information is not intended to replace advice given to you by your health care provider. Make sure you discuss any questions you have with your health care provider.

## 2015-05-29 ENCOUNTER — Other Ambulatory Visit: Payer: Self-pay

## 2015-05-29 DIAGNOSIS — Z1231 Encounter for screening mammogram for malignant neoplasm of breast: Secondary | ICD-10-CM

## 2015-06-18 ENCOUNTER — Ambulatory Visit
Admission: RE | Admit: 2015-06-18 | Discharge: 2015-06-18 | Disposition: A | Discharge status: Home / Self Care | Payer: Commercial Managed Care - HMO | Source: Ambulatory Visit

## 2015-06-18 DIAGNOSIS — Z1231 Encounter for screening mammogram for malignant neoplasm of breast: Secondary | ICD-10-CM

## 2015-11-10 ENCOUNTER — Emergency Department (HOSPITAL_COMMUNITY)
Admission: EM | Admit: 2015-11-10 | Discharge: 2015-11-10 | Disposition: A | Discharge status: Home / Self Care | Payer: Medicare HMO | Attending: Emergency Medicine | Admitting: Emergency Medicine

## 2015-11-10 ENCOUNTER — Encounter (HOSPITAL_COMMUNITY): Payer: Self-pay | Admitting: Emergency Medicine

## 2015-11-10 DIAGNOSIS — Z7982 Long term (current) use of aspirin: Secondary | ICD-10-CM | POA: Insufficient documentation

## 2015-11-10 DIAGNOSIS — I10 Essential (primary) hypertension: Secondary | ICD-10-CM | POA: Insufficient documentation

## 2015-11-10 DIAGNOSIS — Z79899 Other long term (current) drug therapy: Secondary | ICD-10-CM | POA: Insufficient documentation

## 2015-11-10 DIAGNOSIS — R42 Dizziness and giddiness: Secondary | ICD-10-CM

## 2015-11-10 LAB — COMPREHENSIVE METABOLIC PANEL
ALK PHOS: 61 U/L (ref 38–126)
ALT: 15 U/L (ref 14–54)
AST: 15 U/L (ref 15–41)
Albumin: 3.9 g/dL (ref 3.5–5.0)
Anion gap: 6 (ref 5–15)
BUN: 14 mg/dL (ref 6–20)
CALCIUM: 9.4 mg/dL (ref 8.9–10.3)
CHLORIDE: 109 mmol/L (ref 101–111)
CO2: 26 mmol/L (ref 22–32)
CREATININE: 1.07 mg/dL — AB (ref 0.44–1.00)
GFR calc non Af Amer: 50 mL/min — ABNORMAL LOW (ref >60)
GFR, EST AFRICAN AMERICAN: 58 mL/min — AB (ref >60)
Glucose, Bld: 94 mg/dL (ref 65–99)
Potassium: 3.9 mmol/L (ref 3.5–5.1)
SODIUM: 141 mmol/L (ref 135–145)
Total Bilirubin: 0.5 mg/dL (ref 0.3–1.2)
Total Protein: 6.8 g/dL (ref 6.5–8.1)

## 2015-11-10 LAB — CBC WITH DIFFERENTIAL/PLATELET
BASOS ABS: 0 10*3/uL (ref 0.0–0.1)
Basophils Relative: 0 %
Eosinophils Absolute: 0.1 10*3/uL (ref 0.0–0.7)
Eosinophils Relative: 2 %
HCT: 38.6 % (ref 36.0–46.0)
HEMOGLOBIN: 12.2 g/dL (ref 12.0–15.0)
LYMPHS ABS: 1.1 10*3/uL (ref 0.7–4.0)
LYMPHS PCT: 23 %
MCH: 28.2 pg (ref 26.0–34.0)
MCHC: 31.6 g/dL (ref 30.0–36.0)
MCV: 89.4 fL (ref 78.0–100.0)
Monocytes Absolute: 0.3 10*3/uL (ref 0.1–1.0)
Monocytes Relative: 7 %
NEUTROS PCT: 68 %
Neutro Abs: 3.4 10*3/uL (ref 1.7–7.7)
PLATELETS: 248 10*3/uL (ref 150–400)
RBC: 4.32 MIL/uL (ref 3.87–5.11)
RDW: 13.2 % (ref 11.5–15.5)
WBC: 5 10*3/uL (ref 4.0–10.5)

## 2015-11-10 LAB — I-STAT TROPONIN, ED: Troponin i, poc: 0 ng/mL (ref 0.00–0.08)

## 2015-11-10 MED ORDER — MECLIZINE HCL 25 MG PO TABS: 25.0000 mg | ORAL_TABLET | Freq: Three times a day (TID) | ORAL | 0 refills | Status: DC | PRN

## 2015-11-10 NOTE — ED Triage Notes (Signed)
Pt. Stated, Lori Sanchez been dizzy since yesterday, its when I get up.

## 2015-11-10 NOTE — Discharge Instructions (Signed)

## 2015-11-11 NOTE — ED Provider Notes (Signed)
Lori Sanchez Provider Note   CSN: [id number] Arrival date & time: 11/10/15  1057  First Provider Contact:  First MD Initiated Contact with Patient 11/10/15 1842        History   Chief Complaint Chief Complaint  Patient presents with  . Dizziness    HPI Lori Sanchez is a 75 y.o. female.  The history is provided by the patient and a relative.  Dizziness  Quality:  Room spinning Severity:  Moderate Onset quality:  Sudden Timing:  Intermittent Progression:  Resolved Chronicity:  New Context: standing up   Relieved by:  Being still Associated symptoms: no chest pain, no headaches, no shortness of breath and no weakness   Risk factors: hx of vertigo   Risk factors: no hx of stroke and no new medications   Patient reports for past day she has had episodes of dizziness/spinning It is worse when standing up She reports while walking she was "Staggering" but no falls No focal weakness No HA No cp/sob She reports it was worse on car ride into hospital but now resolved She reports h/o vertigo and has been on antivert before This similar to prior vertigo No h/o CVA No visual changes   Past Medical History:  Diagnosis Date  . Arthritis   . Hyperlipemia   . Hypertension     There are no active problems to display for this patient.   History reviewed. No pertinent surgical history.  OB History    No data available       Home Medications    Prior to Admission medications   Medication Sig Start Date End Date Taking? Authorizing Provider  amLODipine (NORVASC) 5 MG tablet Take 2 tablets (10 mg total) by mouth daily. 09/02/11   [name], MD  aspirin EC 81 MG tablet Take 81 mg by mouth daily.    Historical Provider, MD  losartan (COZAAR) 100 MG tablet Take 100 mg by mouth daily.    Historical Provider, MD  meclizine (ANTIVERT) 25 MG tablet Take 1 tablet (25 mg total) by mouth 3 (three) times daily as needed for dizziness. 11/10/15   Lori Fraise, MD    omeprazole (PRILOSEC) 20 MG capsule Take 40 mg by mouth daily.    Historical Provider, MD  pravastatin (PRAVACHOL) 40 MG tablet Take 40 mg by mouth daily.    Historical Provider, MD  predniSONE (DELTASONE) 5 MG tablet Take 5 mg by mouth daily.    Historical Provider, MD    Family History No family history on file.  Social History Social History  Substance Use Topics  . Smoking status: Never Smoker  . Smokeless tobacco: Never Used  . Alcohol use No     Allergies   Review of patient's allergies indicates no known allergies.   Review of Systems Review of Systems  Constitutional: Negative for fever.  Respiratory: Negative for shortness of breath.   Cardiovascular: Negative for chest pain.  Neurological: Positive for dizziness. Negative for speech difficulty, weakness and headaches.  All other systems reviewed and are negative.    Physical Exam Updated Vital Signs BP 165/93 (BP Location: Right Arm)   Pulse 72   Temp 98.9 F (37.2 C) (Oral)   Resp 16   Ht 5\' 3"  (1.6 m)   Wt 63.5 kg   SpO2 99%   BMI 24.80 kg/m   Physical Exam CONSTITUTIONAL: Well developed/well nourished HEAD: Normocephalic/atraumatic EYES: EOMI/PERRL, no nystagmus,  no ptosis ENMT: Mucous membranes moist NECK: supple no meningeal signs, no  bruits CV: S1/S2 noted, no murmurs/rubs/gallops noted LUNGS: Lungs are clear to auscultation bilaterally, no apparent distress ABDOMEN: soft, nontender, no rebound or guarding GU:no cva tenderness NEURO:Awake/alert, face symmetric, no arm or leg drift is noted Equal 5/5 strength with shoulder abduction, elbow flex/extension, wrist flex/extension in upper extremities and equal hand grips bilaterally Equal 5/5 strength with hip flexion,knee flex/extension, foot dorsi/plantar flexion Cranial nerves 3/4/5/6/10/25/08/11/12 tested and intact Gait normal without ataxia No past pointing Sensation to light touch intact in all extremities EXTREMITIES: pulses normal,  full ROM SKIN: warm, color normal PSYCH: no abnormalities of mood noted   ED Treatments / Results  Labs (all labs ordered are listed, but only abnormal results are displayed) Labs Reviewed  COMPREHENSIVE METABOLIC PANEL - Abnormal; Notable for the following:       Result Value   Creatinine, Ser 1.07 (*)    GFR calc non Af Amer 50 (*)    GFR calc Af Amer 58 (*)    All other components within normal limits  CBC WITH DIFFERENTIAL/PLATELET  Randolm Idol, ED    EKG  EKG Interpretation  Date/Time:  Monday November 10 2015 19:12:01 EDT Ventricular Rate:  62 PR Interval:    QRS Duration: 120 QT Interval:  417 QTC Calculation: 424 R Axis:   -42 Text Interpretation:  Sinus rhythm Incomplete RBBB and LAFB Left ventricular hypertrophy Confirmed by Christy Gentles  MD, Angelyse Heslin ([id number]) on 11/10/2015 7:31:42 PM       Radiology No results found.  Procedures Procedures  Medications Ordered in ED Medications - No data to display   Initial Impression / Assessment and Plan / ED Course  I have reviewed the triage vital signs and the nursing notes.  Pertinent labs & imaging results that were available during my care of the patient were reviewed by me and considered in my medical decision making (see chart for details).  Clinical Course  Comment By Time  Labs unremarkable for acute disease  Lori Fraise, MD 07/24 1925    Pt well appearing She reports episodes of dizziness triggered by standing and while riding in car She has no neuro deficits No ataxia By the time of my evaluation, she has been in ER several hrs and symptoms resolved She had no focal signs of stroke She felt for d/c home She had no symptoms to suggest acute cardiac issue Will give Rx for antivert Discussed return precautions  Final Clinical Impressions(s) / ED Diagnoses   Final diagnoses:  Dizziness    New Prescriptions Discharge Medication List as of 11/10/2015  7:33 PM       Lori Fraise,  MD 11/11/15 [id number]

## 2016-01-05 ENCOUNTER — Ambulatory Visit (HOSPITAL_COMMUNITY)
Admission: RE | Admit: 2016-01-05 | Discharge: 2016-01-05 | Disposition: A | Discharge status: Home / Self Care | Payer: Medicare HMO | Source: Ambulatory Visit | Attending: Internal Medicine | Admitting: Internal Medicine

## 2016-01-05 ENCOUNTER — Other Ambulatory Visit (HOSPITAL_COMMUNITY): Payer: Self-pay | Admitting: Internal Medicine

## 2016-01-05 DIAGNOSIS — R0989 Other specified symptoms and signs involving the circulatory and respiratory systems: Secondary | ICD-10-CM

## 2016-01-05 DIAGNOSIS — I6529 Occlusion and stenosis of unspecified carotid artery: Secondary | ICD-10-CM | POA: Diagnosis present

## 2016-01-07 ENCOUNTER — Other Ambulatory Visit: Payer: Self-pay | Admitting: Internal Medicine

## 2016-01-07 DIAGNOSIS — E2839 Other primary ovarian failure: Secondary | ICD-10-CM

## 2016-01-14 ENCOUNTER — Ambulatory Visit
Admission: RE | Admit: 2016-01-14 | Discharge: 2016-01-14 | Disposition: A | Discharge status: Home / Self Care | Payer: Medicare HMO | Source: Ambulatory Visit | Attending: Internal Medicine | Admitting: Internal Medicine

## 2016-01-14 ENCOUNTER — Encounter (HOSPITAL_COMMUNITY): Payer: Self-pay | Admitting: Radiology

## 2016-01-14 DIAGNOSIS — E2839 Other primary ovarian failure: Secondary | ICD-10-CM

## 2018-01-19 ENCOUNTER — Other Ambulatory Visit: Payer: Self-pay | Admitting: Internal Medicine

## 2018-01-19 DIAGNOSIS — M810 Age-related osteoporosis without current pathological fracture: Secondary | ICD-10-CM

## 2018-01-23 ENCOUNTER — Other Ambulatory Visit: Payer: Self-pay | Admitting: Internal Medicine

## 2018-01-23 DIAGNOSIS — Z1231 Encounter for screening mammogram for malignant neoplasm of breast: Secondary | ICD-10-CM

## 2018-03-20 ENCOUNTER — Ambulatory Visit
Admission: RE | Admit: 2018-03-20 | Discharge: 2018-03-20 | Disposition: A | Discharge status: Home / Self Care | Payer: Medicare Other | Source: Ambulatory Visit | Attending: Internal Medicine | Admitting: Internal Medicine

## 2018-03-20 ENCOUNTER — Ambulatory Visit
Admission: RE | Admit: 2018-03-20 | Discharge: 2018-03-20 | Disposition: A | Discharge status: Home / Self Care | Payer: Medicare HMO | Source: Ambulatory Visit | Attending: Internal Medicine | Admitting: Internal Medicine

## 2018-03-20 DIAGNOSIS — Z1231 Encounter for screening mammogram for malignant neoplasm of breast: Secondary | ICD-10-CM

## 2018-03-20 DIAGNOSIS — M810 Age-related osteoporosis without current pathological fracture: Secondary | ICD-10-CM

## 2018-09-23 ENCOUNTER — Other Ambulatory Visit: Payer: Self-pay

## 2018-09-23 DIAGNOSIS — Z20822 Contact with and (suspected) exposure to covid-19: Secondary | ICD-10-CM

## 2018-09-25 LAB — NOVEL CORONAVIRUS, NAA: SARS-CoV-2, NAA: NOT DETECTED

## 2019-07-18 ENCOUNTER — Other Ambulatory Visit: Payer: Self-pay | Admitting: Internal Medicine

## 2019-07-18 DIAGNOSIS — Z1231 Encounter for screening mammogram for malignant neoplasm of breast: Secondary | ICD-10-CM

## 2019-07-31 ENCOUNTER — Ambulatory Visit: Payer: Medicare Other

## 2019-08-29 ENCOUNTER — Ambulatory Visit: Payer: Medicare Other

## 2019-09-20 ENCOUNTER — Ambulatory Visit
Admission: RE | Admit: 2019-09-20 | Discharge: 2019-09-20 | Disposition: A | Discharge status: Home / Self Care | Payer: Medicare Other | Source: Ambulatory Visit | Attending: Internal Medicine | Admitting: Internal Medicine

## 2019-09-20 ENCOUNTER — Other Ambulatory Visit: Payer: Self-pay

## 2019-09-20 DIAGNOSIS — Z1231 Encounter for screening mammogram for malignant neoplasm of breast: Secondary | ICD-10-CM

## 2019-11-20 DIAGNOSIS — H6123 Impacted cerumen, bilateral: Secondary | ICD-10-CM | POA: Insufficient documentation

## 2020-03-02 ENCOUNTER — Encounter (HOSPITAL_COMMUNITY): Payer: Self-pay | Admitting: Obstetrics and Gynecology

## 2020-03-02 ENCOUNTER — Other Ambulatory Visit: Payer: Self-pay

## 2020-03-02 ENCOUNTER — Emergency Department (HOSPITAL_COMMUNITY)
Admission: EM | Admit: 2020-03-02 | Discharge: 2020-03-02 | Disposition: A | Discharge status: Home / Self Care | Payer: Medicare Other | Attending: Emergency Medicine | Admitting: Emergency Medicine

## 2020-03-02 DIAGNOSIS — Z79899 Other long term (current) drug therapy: Secondary | ICD-10-CM | POA: Insufficient documentation

## 2020-03-02 DIAGNOSIS — I10 Essential (primary) hypertension: Secondary | ICD-10-CM | POA: Diagnosis not present

## 2020-03-02 DIAGNOSIS — R21 Rash and other nonspecific skin eruption: Secondary | ICD-10-CM | POA: Insufficient documentation

## 2020-03-02 DIAGNOSIS — Z7982 Long term (current) use of aspirin: Secondary | ICD-10-CM | POA: Diagnosis not present

## 2020-03-02 MED ORDER — VALACYCLOVIR HCL 1 G PO TABS: 1000.0000 mg | ORAL_TABLET | Freq: Three times a day (TID) | ORAL | 0 refills | Status: DC

## 2020-03-02 NOTE — ED Triage Notes (Signed)
Patient reports x3 days ago she started noticing small splotches on her left leg. Patient reports she started taking Rosuvastatin x1 week ago. Patient reports left leg pain

## 2020-03-02 NOTE — Discharge Instructions (Addendum)
Call your dermatologist tomorrow to schedule an appoint to be seen.  You may have herpes zoster or another dermatologic condition.  Return here for fever, worsening rash, bleeding or any other problems

## 2020-03-02 NOTE — ED Provider Notes (Signed)
[facility] DEPT Provider Note   CSN: [id number] Arrival date & time: 03/02/20  1043     History Chief Complaint  Patient presents with  . Leg Pain  . Rash    Lori Sanchez is a 79 y.o. female.  79 year old lady presents with left lower extremity rash times several days.  Rash is localized to the left anterior tibia.  States it does not itch.  Has not noted any burning.  No fever or chills.  No involvement of her oral mucosa.  No prior history of same.        Past Medical History:  Diagnosis Date  . Arthritis   . Hyperlipemia   . Hypertension     There are no problems to display for this patient.   Past Surgical History:  Procedure Laterality Date  . BREAST EXCISIONAL BIOPSY Right      OB History   No obstetric history on file.     Family History  Problem Relation Age of Onset  . Breast cancer Sister 110  . Breast cancer Maternal Aunt     Social History   Tobacco Use  . Smoking status: Never Smoker  . Smokeless tobacco: Never Used  Substance Use Topics  . Alcohol use: No  . Drug use: No    Home Medications Prior to Admission medications   Medication Sig Start Date End Date Taking? Authorizing Provider  amLODipine (NORVASC) 5 MG tablet Take 2 tablets (10 mg total) by mouth daily. 09/02/11   [name], MD  aspirin EC 81 MG tablet Take 81 mg by mouth daily.    [provider]  losartan (COZAAR) 100 MG tablet Take 100 mg by mouth daily.    [provider]  meclizine (ANTIVERT) 25 MG tablet Take 1 tablet (25 mg total) by mouth 3 (three) times daily as needed for dizziness. 11/10/15   [name], MD  omeprazole (PRILOSEC) 20 MG capsule Take 40 mg by mouth daily.    [provider]  pravastatin (PRAVACHOL) 40 MG tablet Take 40 mg by mouth daily.    [provider]  predniSONE (DELTASONE) 5 MG tablet Take 5 mg by mouth daily.    [provider]    Allergies      Patient has no known allergies.  Review of Systems   Review of Systems  All other systems reviewed and are negative.   Physical Exam Updated Vital Signs BP 136/68 (BP Location: Right Arm)   Pulse 79   Temp 98.6 F (37 C) (Oral)   Resp 16   SpO2 100%   Physical Exam Vitals and nursing note reviewed.  Constitutional:      General: She is not in acute distress.    Appearance: Normal appearance. She is well-developed. She is not toxic-appearing.  HENT:     Head: Normocephalic and atraumatic.  Eyes:     General: Lids are normal.     Conjunctiva/sclera: Conjunctivae normal.     Pupils: Pupils are equal, round, and reactive to light.  Neck:     Thyroid: No thyroid mass.     Trachea: No tracheal deviation.  Cardiovascular:     Rate and Rhythm: Normal rate and regular rhythm.     Heart sounds: Normal heart sounds. No murmur heard.  No gallop.   Pulmonary:     Effort: Pulmonary effort is normal. No respiratory distress.     Breath sounds: Normal breath sounds. No stridor. No decreased breath sounds, wheezing,  rhonchi or rales.  Abdominal:     General: Bowel sounds are normal. There is no distension.     Palpations: Abdomen is soft.     Tenderness: There is no abdominal tenderness. There is no rebound.  Musculoskeletal:        General: No tenderness. Normal range of motion.     Cervical back: Normal range of motion and neck supple.  Skin:    General: Skin is warm and dry.     Findings: No abrasion or rash.          Comments: Appears in dermatomal distribution   Neurological:     Mental Status: She is alert and oriented to person, place, and time.     GCS: GCS eye subscore is 4. GCS verbal subscore is 5. GCS motor subscore is 6.     Cranial Nerves: No cranial nerve deficit.     Sensory: No sensory deficit.  Psychiatric:        Speech: Speech normal.        Behavior: Behavior normal.     ED Results / Procedures / Treatments   Labs (all labs ordered are listed, but  only abnormal results are displayed) Labs Reviewed - No data to display  EKG None  Radiology No results found.  Procedures Procedures (including critical care time)  Medications Ordered in ED Medications - No data to display  ED Course  I have reviewed the triage vital signs and the nursing notes.  Pertinent labs & imaging results that were available during my care of the patient were reviewed by me and considered in my medical decision making (see chart for details).    MDM Rules/Calculators/A&P                          Patient with symptoms concerning for possible zoster.  Will place on Valtrex and she has appointment to see her dermatologist next week.  Return precautions given Final Clinical Impression(s) / ED Diagnoses Final diagnoses:  None    Rx / DC Orders ED Discharge Orders    None       Lacretia Leigh, MD 03/02/20 1118

## 2020-06-17 DIAGNOSIS — H25013 Cortical age-related cataract, bilateral: Secondary | ICD-10-CM | POA: Diagnosis not present

## 2020-06-17 DIAGNOSIS — H40013 Open angle with borderline findings, low risk, bilateral: Secondary | ICD-10-CM | POA: Diagnosis not present

## 2020-06-17 DIAGNOSIS — H40033 Anatomical narrow angle, bilateral: Secondary | ICD-10-CM | POA: Diagnosis not present

## 2020-06-17 DIAGNOSIS — H2513 Age-related nuclear cataract, bilateral: Secondary | ICD-10-CM | POA: Diagnosis not present

## 2020-06-17 DIAGNOSIS — H2511 Age-related nuclear cataract, right eye: Secondary | ICD-10-CM | POA: Diagnosis not present

## 2020-06-25 DIAGNOSIS — M25531 Pain in right wrist: Secondary | ICD-10-CM | POA: Diagnosis not present

## 2020-06-25 DIAGNOSIS — R42 Dizziness and giddiness: Secondary | ICD-10-CM | POA: Diagnosis not present

## 2020-07-14 DIAGNOSIS — M150 Primary generalized (osteo)arthritis: Secondary | ICD-10-CM | POA: Diagnosis not present

## 2020-07-14 DIAGNOSIS — M810 Age-related osteoporosis without current pathological fracture: Secondary | ICD-10-CM | POA: Diagnosis not present

## 2020-07-14 DIAGNOSIS — R5383 Other fatigue: Secondary | ICD-10-CM | POA: Diagnosis not present

## 2020-07-14 DIAGNOSIS — M316 Other giant cell arteritis: Secondary | ICD-10-CM | POA: Diagnosis not present

## 2020-07-14 DIAGNOSIS — B029 Zoster without complications: Secondary | ICD-10-CM | POA: Diagnosis not present

## 2020-07-18 HISTORY — PX: CATARACT EXTRACTION W/ INTRAOCULAR LENS IMPLANT: SHX1309

## 2020-07-23 DIAGNOSIS — H2511 Age-related nuclear cataract, right eye: Secondary | ICD-10-CM | POA: Diagnosis not present

## 2020-07-23 DIAGNOSIS — H25811 Combined forms of age-related cataract, right eye: Secondary | ICD-10-CM | POA: Diagnosis not present

## 2020-07-29 DIAGNOSIS — I10 Essential (primary) hypertension: Secondary | ICD-10-CM | POA: Diagnosis not present

## 2020-07-29 DIAGNOSIS — M810 Age-related osteoporosis without current pathological fracture: Secondary | ICD-10-CM | POA: Diagnosis not present

## 2020-07-29 DIAGNOSIS — Z5181 Encounter for therapeutic drug level monitoring: Secondary | ICD-10-CM | POA: Diagnosis not present

## 2020-07-29 DIAGNOSIS — H2512 Age-related nuclear cataract, left eye: Secondary | ICD-10-CM | POA: Diagnosis not present

## 2020-07-29 DIAGNOSIS — M79641 Pain in right hand: Secondary | ICD-10-CM | POA: Diagnosis not present

## 2020-07-29 DIAGNOSIS — D649 Anemia, unspecified: Secondary | ICD-10-CM | POA: Diagnosis not present

## 2020-07-29 DIAGNOSIS — M316 Other giant cell arteritis: Secondary | ICD-10-CM | POA: Diagnosis not present

## 2020-07-29 DIAGNOSIS — E7800 Pure hypercholesterolemia, unspecified: Secondary | ICD-10-CM | POA: Diagnosis not present

## 2020-07-29 DIAGNOSIS — H25012 Cortical age-related cataract, left eye: Secondary | ICD-10-CM | POA: Diagnosis not present

## 2020-08-04 DIAGNOSIS — D649 Anemia, unspecified: Secondary | ICD-10-CM | POA: Diagnosis not present

## 2020-08-06 DIAGNOSIS — H2512 Age-related nuclear cataract, left eye: Secondary | ICD-10-CM | POA: Diagnosis not present

## 2020-08-06 DIAGNOSIS — H25812 Combined forms of age-related cataract, left eye: Secondary | ICD-10-CM | POA: Diagnosis not present

## 2020-08-06 DIAGNOSIS — H25012 Cortical age-related cataract, left eye: Secondary | ICD-10-CM | POA: Diagnosis not present

## 2020-08-08 DIAGNOSIS — D649 Anemia, unspecified: Secondary | ICD-10-CM | POA: Diagnosis not present

## 2020-08-14 DIAGNOSIS — D649 Anemia, unspecified: Secondary | ICD-10-CM | POA: Diagnosis not present

## 2020-08-27 DIAGNOSIS — M316 Other giant cell arteritis: Secondary | ICD-10-CM | POA: Diagnosis not present

## 2020-08-27 DIAGNOSIS — R710 Precipitous drop in hematocrit: Secondary | ICD-10-CM | POA: Diagnosis not present

## 2020-08-27 DIAGNOSIS — Z8711 Personal history of peptic ulcer disease: Secondary | ICD-10-CM | POA: Diagnosis not present

## 2020-08-27 DIAGNOSIS — R195 Other fecal abnormalities: Secondary | ICD-10-CM | POA: Diagnosis not present

## 2020-09-10 DIAGNOSIS — D509 Iron deficiency anemia, unspecified: Secondary | ICD-10-CM | POA: Diagnosis not present

## 2020-09-10 DIAGNOSIS — K3189 Other diseases of stomach and duodenum: Secondary | ICD-10-CM | POA: Diagnosis not present

## 2020-09-10 DIAGNOSIS — K2930 Chronic superficial gastritis without bleeding: Secondary | ICD-10-CM | POA: Diagnosis not present

## 2020-09-16 DIAGNOSIS — K2930 Chronic superficial gastritis without bleeding: Secondary | ICD-10-CM | POA: Diagnosis not present

## 2020-09-16 DIAGNOSIS — D649 Anemia, unspecified: Secondary | ICD-10-CM | POA: Diagnosis not present

## 2020-09-17 ENCOUNTER — Encounter: Payer: Self-pay | Admitting: *Deleted

## 2020-09-19 ENCOUNTER — Telehealth: Payer: Self-pay | Admitting: Hematology and Oncology

## 2020-09-19 NOTE — Telephone Encounter (Signed)
Received a new hem referral from [facility] for anemia. MS. Harp has been cld and scheduled to see Dr. Chryl Heck on 6/7 at 10am. Pt aware to arrive 20 minutes early.

## 2020-09-22 ENCOUNTER — Encounter: Payer: Self-pay | Admitting: Diagnostic Neuroimaging

## 2020-09-22 ENCOUNTER — Ambulatory Visit: Payer: Medicare Other | Admitting: Diagnostic Neuroimaging

## 2020-09-22 VITALS — BP 127/81 | HR 83 | Ht 62.0 in | Wt 122.8 lb

## 2020-09-22 DIAGNOSIS — M79641 Pain in right hand: Secondary | ICD-10-CM

## 2020-09-22 DIAGNOSIS — R29898 Other symptoms and signs involving the musculoskeletal system: Secondary | ICD-10-CM | POA: Diagnosis not present

## 2020-09-22 DIAGNOSIS — M79642 Pain in left hand: Secondary | ICD-10-CM | POA: Diagnosis not present

## 2020-09-22 NOTE — Patient Instructions (Signed)
RIGHT HAND WEAKNESS - check EMG/NCS (NERVE TESTING) - use wrist splints at bedtime

## 2020-09-22 NOTE — Progress Notes (Signed)
[facility]  PATIENT: Lori Sanchez DOB: [birth year]  REFERRING CLINICIAN: Valinda Party, MD HISTORY FROM: patient  REASON FOR VISIT: new consult   HISTORICAL  CHIEF COMPLAINT:  Chief Complaint  Patient presents with  . Right hand Weakness    Rm 6 New Pt, "lost strength in my right hand, can't make a fist, pain went up my inner arm; left hand also but not as bad"     HISTORY OF PRESENT ILLNESS:    80 year old female with Rhinitis, osteoarthritis, hypertension, depression, here for evaluation of hand weakness and pain.  Symptoms started around March 2022.  She noticed it really in the right greater than left hand.  She felt difficulty making a grip or fist.  Also having pain and numbness in the hands.   REVIEW OF SYSTEMS: Full 14 system review of systems performed and negative with exception of: as per HPI.  ALLERGIES: No Known Allergies  HOME MEDICATIONS: Outpatient Medications Prior to Visit  Medication Sig Dispense Refill  . alendronate (FOSAMAX) 70 MG tablet TAKE 1 TABLET BY MOUTH  WEEKLY 1/2 HOUR BEFORE THE  FIRST FOOD, BEVERAGE OR  MEDICINE OF THE DAY WITH  PLAIN WATER    . amLODipine (NORVASC) 5 MG tablet Take 2 tablets (10 mg total) by mouth daily. 20 tablet 0  . Iron, Ferrous Sulfate, 325 (65 Fe) MG TABS Take by mouth 2 (two) times daily.    Marland Kitchen losartan (COZAAR) 100 MG tablet Take 100 mg by mouth daily.    . pantoprazole (PROTONIX) 40 MG tablet Take 1 tablet by mouth 2 (two) times daily.    . predniSONE (DELTASONE) 5 MG tablet Take 5 mg by mouth daily.    Marland Kitchen aspirin EC 81 MG tablet Take 81 mg by mouth daily. (Patient not taking: Reported on 09/22/2020)    . meclizine (ANTIVERT) 25 MG tablet Take 1 tablet (25 mg total) by mouth 3 (three) times daily as needed for dizziness. (Patient not taking: Reported on 09/22/2020) 15 tablet 0  . pravastatin (PRAVACHOL) 40 MG tablet Take 40 mg by mouth daily. (Patient not taking: Reported on 09/22/2020)    .  valACYclovir (VALTREX) 1000 MG tablet Take 1 tablet (1,000 mg total) by mouth 3 (three) times daily. (Patient not taking: Reported on 09/22/2020) 21 tablet 0  . omeprazole (PRILOSEC) 20 MG capsule Take 40 mg by mouth daily. (Patient not taking: Reported on 09/22/2020)     No facility-administered medications prior to visit.    PAST MEDICAL HISTORY: Past Medical History:  Diagnosis Date  . Arthritis   . GERD (gastroesophageal reflux disease)   . Hyperlipemia   . Hypertension   . Osteoporosis   . Shingles   . Temporal arteritis ([place])     PAST SURGICAL HISTORY: Past Surgical History:  Procedure Laterality Date  . ABDOMINAL HYSTERECTOMY  1978  . BREAST EXCISIONAL BIOPSY Right 2009   benign  . CATARACT EXTRACTION W/ INTRAOCULAR LENS IMPLANT Bilateral 07/2020    FAMILY HISTORY: Family History  Problem Relation Age of Onset  . Breast cancer Sister 38  . Breast cancer Maternal Aunt   . Kidney failure Mother   . Heart failure Father   . Cancer Brother     SOCIAL HISTORY: Social History   Socioeconomic History  . Marital status: Widowed    Spouse name: Not on file  . Number of children: 4  . Years of education: Not on file  . Highest education level: GED or equivalent  Occupational History    Comment: retired  Tobacco Use  . Smoking status: Former Research scientist (life sciences)  . Smokeless tobacco: Never Used  . Tobacco comment: quit 1985  Substance and Sexual Activity  . Alcohol use: No    Comment: quit 1985  . Drug use: No  . Sexual activity: Yes  Other Topics Concern  . Not on file  Social History Narrative   Lives with dgtr, 2 grandchildren   Caffeine- tea 1-2 day   Social Determinants of Health   Financial Resource Strain: Not on file  Food Insecurity: Not on file  Transportation Needs: Not on file  Physical Activity: Not on file  Stress: Not on file  Social Connections: Not on file  Intimate Partner Violence: Not on file     PHYSICAL EXAM  GENERAL  EXAM/CONSTITUTIONAL: Vitals:  Vitals:   09/22/20 0956  BP: 127/81  Pulse: 83  Weight: 122 lb 12.8 oz (55.7 kg)  Height: 5\' 2"  (1.575 m)   Body mass index is 22.46 kg/m. Wt Readings from Last 3 Encounters:  09/22/20 122 lb 12.8 oz (55.7 kg)  11/10/15 140 lb (63.5 kg)  04/01/14 150 lb (68 kg)    Patient is in no distress; well developed, nourished and groomed; neck is supple  CARDIOVASCULAR:  Examination of carotid arteries is normal; no carotid bruits  Regular rate and rhythm, no murmurs  Examination of peripheral vascular system by observation and palpation is normal  EYES:  Ophthalmoscopic exam of optic discs and posterior segments is normal; no papilledema or hemorrhages No exam data present  MUSCULOSKELETAL:  Gait, strength, tone, movements noted in Neurologic exam below  NEUROLOGIC: MENTAL STATUS:  No flowsheet data found.  awake, alert, oriented to person, place and time  recent and remote memory intact  normal attention and concentration  language fluent, comprehension intact, naming intact  fund of knowledge appropriate  CRANIAL NERVE:   2nd - no papilledema on fundoscopic exam  2nd, 3rd, 4th, 6th - pupils equal and reactive to light, visual fields full to confrontation, extraocular muscles intact, no nystagmus  5th - facial sensation symmetric  7th - facial strength symmetric  8th - hearing intact  9th - palate elevates symmetrically, uvula midline  11th - shoulder shrug symmetric  12th - tongue protrusion midline  MOTOR:   normal bulk and tone, full strength in the BUE, BLE; EXCEPT ATROPHY AND WEAKNESS OF BILATERAL THENAR MUSCLES  SENSORY:   normal and symmetric to light touch, pinprick, temperature, vibration; EXCEPT DECR IN BILATERAL FINGERS (1-4)  NEG PHALENS  POSITIVE TINELS ON R > L  COORDINATION:   finger-nose-finger, fine finger movements normal  REFLEXES:   deep tendon reflexes TRACE and symmetric  GAIT/STATION:    narrow based gait     DIAGNOSTIC DATA (LABS, IMAGING, TESTING) - I reviewed patient records, labs, notes, testing and imaging myself where available.  Lab Results  Component Value Date   WBC 5.0 11/10/2015   HGB 12.2 11/10/2015   HCT 38.6 11/10/2015   MCV 89.4 11/10/2015   PLT 248 11/10/2015      Component Value Date/Time   NA 141 11/10/2015 1133   K 3.9 11/10/2015 1133   CL 109 11/10/2015 1133   CO2 26 11/10/2015 1133   GLUCOSE 94 11/10/2015 1133   BUN 14 11/10/2015 1133   CREATININE 1.07 (H) 11/10/2015 1133   CALCIUM 9.4 11/10/2015 1133   PROT 6.8 11/10/2015 1133   ALBUMIN 3.9 11/10/2015 1133   AST 15 11/10/2015 1133  ALT 15 11/10/2015 1133   ALKPHOS 61 11/10/2015 1133   BILITOT 0.5 11/10/2015 1133   GFRNONAA 50 (L) 11/10/2015 1133   GFRAA 58 (L) 11/10/2015 1133   No results found for: CHOL, HDL, LDLCALC, LDLDIRECT, TRIG, CHOLHDL No results found for: HGBA1C No results found for: VITAMINB12 No results found for: TSH     ASSESSMENT AND PLAN  80 y.o. year old female here with:   Dx:  1. Pain in both hands   2. Weakness of both hands      PLAN:  RIGHT HAND WEAKNESS (likely bilateral carpal tunnel syndrome) - check EMG/NCS; use wrist splints at bedtime  Orders Placed This Encounter  Procedures  . NCV with EMG(electromyography)   Return for for NCV/EMG.    Penni Bombard, MD 10/25/4444, 19:01 AM Certified in Neurology, Neurophysiology and Neuroimaging  [facility] [address], [place], [place] [address] [phone]

## 2020-09-23 ENCOUNTER — Encounter: Payer: Self-pay | Admitting: Hematology and Oncology

## 2020-09-23 ENCOUNTER — Inpatient Hospital Stay: Payer: Medicare Other | Attending: Hematology and Oncology | Admitting: Hematology and Oncology

## 2020-09-23 ENCOUNTER — Inpatient Hospital Stay: Payer: Medicare Other

## 2020-09-23 ENCOUNTER — Other Ambulatory Visit: Payer: Self-pay

## 2020-09-23 VITALS — BP 142/65 | HR 80 | Temp 98.0°F | Resp 18 | Wt 122.1 lb

## 2020-09-23 DIAGNOSIS — D649 Anemia, unspecified: Secondary | ICD-10-CM | POA: Diagnosis not present

## 2020-09-23 LAB — TYPE AND SCREEN
ABO/RH(D): AB POS
Antibody Screen: NEGATIVE

## 2020-09-23 LAB — IRON AND TIBC
Iron: 13 ug/dL — ABNORMAL LOW (ref 41–142)
Saturation Ratios: 7 % — ABNORMAL LOW (ref 21–57)
TIBC: 191 ug/dL — ABNORMAL LOW (ref 236–444)
UIBC: 179 ug/dL (ref 120–384)

## 2020-09-23 LAB — LACTATE DEHYDROGENASE: LDH: 150 U/L (ref 98–192)

## 2020-09-23 LAB — CMP (CANCER CENTER ONLY)
ALT: 10 U/L (ref 0–44)
AST: 13 U/L — ABNORMAL LOW (ref 15–41)
Albumin: 2.8 g/dL — ABNORMAL LOW (ref 3.5–5.0)
Alkaline Phosphatase: 94 U/L (ref 38–126)
Anion gap: 12 (ref 5–15)
BUN: 11 mg/dL (ref 8–23)
CO2: 24 mmol/L (ref 22–32)
Calcium: 9.9 mg/dL (ref 8.9–10.3)
Chloride: 104 mmol/L (ref 98–111)
Creatinine: 0.87 mg/dL (ref 0.44–1.00)
GFR, Estimated: >60 mL/min (ref >60)
Glucose, Bld: 91 mg/dL (ref 70–99)
Potassium: 3.9 mmol/L (ref 3.5–5.1)
Sodium: 140 mmol/L (ref 135–145)
Total Bilirubin: 0.5 mg/dL (ref 0.3–1.2)
Total Protein: 8.3 g/dL — ABNORMAL HIGH (ref 6.5–8.1)

## 2020-09-23 LAB — CBC WITH DIFFERENTIAL/PLATELET
Abs Immature Granulocytes: 0.05 10*3/uL (ref 0.00–0.07)
Basophils Absolute: 0.1 10*3/uL (ref 0.0–0.1)
Basophils Relative: 1 %
Eosinophils Absolute: 0.2 10*3/uL (ref 0.0–0.5)
Eosinophils Relative: 2 %
HCT: 24 % — ABNORMAL LOW (ref 36.0–46.0)
Hemoglobin: 7.1 g/dL — ABNORMAL LOW (ref 12.0–15.0)
Immature Granulocytes: 1 %
Lymphocytes Relative: 11 %
Lymphs Abs: 0.8 10*3/uL (ref 0.7–4.0)
MCH: 24.3 pg — ABNORMAL LOW (ref 26.0–34.0)
MCHC: 29.6 g/dL — ABNORMAL LOW (ref 30.0–36.0)
MCV: 82.2 fL (ref 80.0–100.0)
Monocytes Absolute: 1 10*3/uL (ref 0.1–1.0)
Monocytes Relative: 13 %
Neutro Abs: 5.6 10*3/uL (ref 1.7–7.7)
Neutrophils Relative %: 72 %
Platelets: 536 10*3/uL — ABNORMAL HIGH (ref 150–400)
RBC: 2.92 MIL/uL — ABNORMAL LOW (ref 3.87–5.11)
RDW: 15.8 % — ABNORMAL HIGH (ref 11.5–15.5)
WBC: 7.7 10*3/uL (ref 4.0–10.5)
nRBC: 0 % (ref 0.0–0.2)

## 2020-09-23 LAB — RETICULOCYTES
Immature Retic Fract: 31 % — ABNORMAL HIGH (ref 2.3–15.9)
RBC.: 2.89 MIL/uL — ABNORMAL LOW (ref 3.87–5.11)
Retic Count, Absolute: 54.9 10*3/uL (ref 19.0–186.0)
Retic Ct Pct: 1.9 % (ref 0.4–3.1)

## 2020-09-23 LAB — VITAMIN B12: Vitamin B-12: 304 pg/mL (ref 180–914)

## 2020-09-23 LAB — FERRITIN: Ferritin: 688 ng/mL — ABNORMAL HIGH (ref 11–307)

## 2020-09-23 NOTE — Progress Notes (Signed)
[name] CONSULT NOTE  Patient Care Team: [name], MD as PCP - General (Internal Medicine)  CHIEF COMPLAINTS/PURPOSE OF CONSULTATION:  Anemia,  ASSESSMENT & PLAN:   [name] who was referred to hematology for evaluation of possible iron deficiency anemia.  Patient apparently has been feeling more fatigued, has lost appetite, lost over 20 pounds of weight in the past several months, has noticed some occasional black stools even prior to iron supplementation.  She otherwise denies any health complaints.  Physical examination today, elderly female in no acute distress, some palpable fullness in the left axilla concerning for left axillary lymphadenopathy, prominent clavicle on the right side at the sternal junction, no other findings. I have reviewed her labs, not entirely suggestive of iron deficiency anemia. Have ordered other labs including evaluation for nutritional deficiencies, SPEP, hemolysis labs today.  I have also ordered screening mammogram since she is due for 1 and given the questionable left axillary fullness.  If her creatinine permits, I will order systemic imaging, high suspicion for carcinoma in this patient causing all the above-mentioned symptoms and anemia. She is agreeable to all the above-mentioned recommendations.  She will return to clinic in 2 weeks to review labs and to discuss any additional recommendations.  She should proceed with colonoscopy as planned on June 28. Thank you for consulting [place] in the care of this patient.  Please do not hesitate to contact us with any additional questions or concerns.   HISTORY OF PRESENTING ILLNESS:  Lori Sanchez 80 y.o. female is here because of anemia.  This is a very pleasant 80 year old African-American female patient with past medical history significant for temporal arteritis, but not biopsy-proven on chronic prednisone), osteoporosis on alendronate, hypertension,  dyslipidemia referred to hematology for evaluation of so-called iron deficiency anemia.  Patient apparently has been scheduled for colonoscopy already on June 20.  She cannot remember the last time she had a colonoscopy, it was many years ago.  Since the beginning of this year, see she says she has gone downhill, feels more tired, lost weight, has no appetite.  She may have lost about 20 pounds in the past few months.  With regards to any change in bowel habits, she does mention black stools even prior to iron supplementation.  No other change in bowel habits or urinary habits.  No pelvic pain.  No change in breathing.  Her last mammogram was last year and it was without any concerns.  She does have family history of breast cancer in her maternal aunt and sister.  Prior to January of this year, she was a healthy person, was very independent, was able to do all her daily chores but now she feels very very tired with most of the stuff.  She is currently on iron supplementation.  She was also started on pantoprazole 40 mg twice a day because she had history of gastric ulcer and she is has chronic prednisone use. Rest of the pertinent 10 point ROS reviewed and negative.    MEDICAL HISTORY:  Past Medical History:  Diagnosis Date  . Arthritis   . GERD (gastroesophageal reflux disease)   . Hyperlipemia   . Hypertension   . Osteoporosis   . Shingles   . Temporal arteritis ([place])     SURGICAL HISTORY: Past Surgical History:  Procedure Laterality Date  . ABDOMINAL HYSTERECTOMY  1978  . BREAST EXCISIONAL BIOPSY Right 2009   benign  . CATARACT EXTRACTION W/ INTRAOCULAR  LENS IMPLANT Bilateral 07/2020    SOCIAL HISTORY: Social History   Socioeconomic History  . Marital status: Widowed    Spouse name: Not on file  . Number of children: 4  . Years of education: Not on file  . Highest education level: GED or equivalent  Occupational History    Comment: retired  Tobacco Use  . Smoking status:  Former Research scientist (life sciences)  . Smokeless tobacco: Never Used  . Tobacco comment: quit 1985  Substance and Sexual Activity  . Alcohol use: No    Comment: quit 1985  . Drug use: No  . Sexual activity: Yes  Other Topics Concern  . Not on file  Social History Narrative   Lives with dgtr, 2 grandchildren   Caffeine- tea 1-2 day   Social Determinants of Health   Financial Resource Strain: Not on file  Food Insecurity: Not on file  Transportation Needs: Not on file  Physical Activity: Not on file  Stress: Not on file  Social Connections: Not on file  Intimate Partner Violence: Not on file    FAMILY HISTORY: Family History  Problem Relation Age of Onset  . Breast cancer Sister 36  . Breast cancer Maternal Aunt   . Kidney failure Mother   . Heart failure Father   . Cancer Brother     ALLERGIES:  has No Known Allergies.  MEDICATIONS:  Current Outpatient Medications  Medication Sig Dispense Refill  . alendronate (FOSAMAX) 70 MG tablet TAKE 1 TABLET BY MOUTH  WEEKLY 1/2 HOUR BEFORE THE  FIRST FOOD, BEVERAGE OR  MEDICINE OF THE DAY WITH  PLAIN WATER    . amLODipine (NORVASC) 5 MG tablet Take 2 tablets (10 mg total) by mouth daily. 20 tablet 0  . Iron, Ferrous Sulfate, 325 (65 Fe) MG TABS Take by mouth 2 (two) times daily.    Marland Kitchen losartan (COZAAR) 100 MG tablet Take 100 mg by mouth daily.    . pantoprazole (PROTONIX) 40 MG tablet Take 1 tablet by mouth 2 (two) times daily.    . predniSONE (DELTASONE) 5 MG tablet Take 5 mg by mouth daily.    Marland Kitchen aspirin EC 81 MG tablet Take 81 mg by mouth daily. (Patient not taking: No sig reported)    . meclizine (ANTIVERT) 25 MG tablet Take 1 tablet (25 mg total) by mouth 3 (three) times daily as needed for dizziness. (Patient not taking: No sig reported) 15 tablet 0  . pravastatin (PRAVACHOL) 40 MG tablet Take 40 mg by mouth daily. (Patient not taking: No sig reported)    . valACYclovir (VALTREX) 1000 MG tablet Take 1 tablet (1,000 mg total) by mouth 3 (three)  times daily. (Patient not taking: No sig reported) 21 tablet 0   No current facility-administered medications for this visit.     PHYSICAL EXAMINATION: ECOG PERFORMANCE STATUS: 2 - Symptomatic, <50% confined to bed  Vitals:   09/23/20 1029  BP: (!) 142/65  Pulse: 80  Resp: 18  Temp: 98 F (36.7 C)  SpO2: 100%   Filed Weights   09/23/20 1029  Weight: 122 lb 1.6 oz (55.4 kg)    GENERAL:alert, no distress and comfortable SKIN: skin color, texture, turgor are normal, no rashes or significant lesions EYES: normal, conjunctiva are pink and non-injected, sclera clear OROPHARYNX:no exudate, no erythema and lips, buccal mucosa, and tongue normal  NECK: supple, thyroid normal size, non-tender, without nodularity LYMPH: Some palpable fullness and concern for axillary lymphadenopathy in the left axilla.  Right clavicle appears  more prominent at the sternal junction, patient cannot remember any history of trauma or fractures.  No other palpable lymphadenopathy.  HEART: regular rate & rhythm and no murmurs and no lower extremity edema ABDOMEN:abdomen soft, non-tender and normal bowel sounds Musculoskeletal:no cyanosis of digits and no clubbing  PSYCH: alert & oriented x 3 with fluent speech NEURO: no focal motor/sensory deficits  LABORATORY DATA:  I have reviewed the data as listed Lab Results  Component Value Date   WBC 5.0 11/10/2015   HGB 12.2 11/10/2015   HCT 38.6 11/10/2015   MCV 89.4 11/10/2015   PLT 248 11/10/2015     Chemistry      Component Value Date/Time   NA 141 11/10/2015 1133   K 3.9 11/10/2015 1133   CL 109 11/10/2015 1133   CO2 26 11/10/2015 1133   BUN 14 11/10/2015 1133   CREATININE 1.07 (H) 11/10/2015 1133      Component Value Date/Time   CALCIUM 9.4 11/10/2015 1133   ALKPHOS 61 11/10/2015 1133   AST 15 11/10/2015 1133   ALT 15 11/10/2015 1133   BILITOT 0.5 11/10/2015 1133     I reviewed her labs.  Overall I am not convinced this is iron deficiency  anemia.  Her transferrin, TIBC have been low.  Ferritin was 514 April.  Her degree of anemia is disproportionate to the degree of microcytosis.  I wonder if there is other reason for her anemia.  I agree with the scheduled colonoscopy.  RADIOGRAPHIC STUDIES: I have personally reviewed the radiological images as listed and agreed with the findings in the report. No results found.  All questions were answered. The patient knows to call the clinic with any problems, questions or concerns. I spent 45 minutes in the care of this patient including H and P, review of records, counseling and coordination of care. I have reviewed available labs, discussed about other common causes of anemia, laboratory evaluation needed, recommended considering imaging to evaluate for left axillary fullness, ordered a screening mammogram as well.    Benay Pike, MD 09/23/2020 10:32 AM

## 2020-09-24 LAB — PROTEIN ELECTROPHORESIS, SERUM, WITH REFLEX
A/G Ratio: 0.7 (ref 0.7–1.7)
Albumin ELP: 3 g/dL (ref 2.9–4.4)
Alpha-1-Globulin: 0.5 g/dL — ABNORMAL HIGH (ref 0.0–0.4)
Alpha-2-Globulin: 1.1 g/dL — ABNORMAL HIGH (ref 0.4–1.0)
Beta Globulin: 1.2 g/dL (ref 0.7–1.3)
Gamma Globulin: 1.6 g/dL (ref 0.4–1.8)
Globulin, Total: 4.4 g/dL — ABNORMAL HIGH (ref 2.2–3.9)
Total Protein ELP: 7.4 g/dL (ref 6.0–8.5)

## 2020-09-24 LAB — FOLATE RBC
Folate, Hemolysate: 378 ng/mL
Folate, RBC: 1556 ng/mL
Hematocrit: 24.3 % — ABNORMAL LOW (ref 34.0–46.6)

## 2020-09-27 ENCOUNTER — Other Ambulatory Visit: Payer: Self-pay

## 2020-09-27 ENCOUNTER — Ambulatory Visit
Admission: RE | Admit: 2020-09-27 | Discharge: 2020-09-27 | Disposition: A | Discharge status: Home / Self Care | Payer: Medicare Other | Source: Ambulatory Visit | Attending: Hematology and Oncology | Admitting: Hematology and Oncology

## 2020-09-27 DIAGNOSIS — D649 Anemia, unspecified: Secondary | ICD-10-CM

## 2020-09-29 ENCOUNTER — Other Ambulatory Visit: Payer: Self-pay | Admitting: Hematology and Oncology

## 2020-09-29 DIAGNOSIS — R591 Generalized enlarged lymph nodes: Secondary | ICD-10-CM

## 2020-10-06 DIAGNOSIS — K644 Residual hemorrhoidal skin tags: Secondary | ICD-10-CM | POA: Diagnosis not present

## 2020-10-06 DIAGNOSIS — D509 Iron deficiency anemia, unspecified: Secondary | ICD-10-CM | POA: Diagnosis not present

## 2020-10-06 DIAGNOSIS — K648 Other hemorrhoids: Secondary | ICD-10-CM | POA: Diagnosis not present

## 2020-10-06 DIAGNOSIS — D120 Benign neoplasm of cecum: Secondary | ICD-10-CM | POA: Diagnosis not present

## 2020-10-06 DIAGNOSIS — K5730 Diverticulosis of large intestine without perforation or abscess without bleeding: Secondary | ICD-10-CM | POA: Diagnosis not present

## 2020-10-06 NOTE — Progress Notes (Signed)
[facility] CONSULT NOTE  Patient Care Team: [name], MD as PCP - General (Internal Medicine)  CHIEF COMPLAINTS/PURPOSE OF CONSULTATION:  Severe anemia follow up  ASSESSMENT & PLAN:   [name] who was referred to hematology for evaluation of possible iron deficiency anemia. During her initial visit, she was noted to have normocytic normochromic anemia, high ferritin consistent with iron deficiency anemia.  Since she is 89, reported black stools, weight loss along with severe anemia, she had endoscopy and colonoscopy which according to the patient were unremarkable. She does not have any evidence of myeloma protein on serum protein electrophoresis, creatinine is normal, no concerns for active myeloma. No evidence of nutritional deficiency or hemolysis. I have recommended that we proceed with systemic imaging given the unexplained weight loss, severe anemia as well as also proceed with bone marrow aspiration biopsy.   She is agreeable to both of these recommendations.  CT-guided bone marrow biopsy as well as CT chest abdomen pelvis ordered. She will return to clinic in about 2 or 3 weeks.  If her labs today shows hemoglobin less than 7, we will arrange for a blood transfusion.  We also briefly discussed about role of blood transfusion and severe anemia.  She understands that there is a small risk of severe allergic reaction with blood products.  Thank you for consulting [place] in the care of this patient.  Please do not hesitate to contact us with any additional questions or concerns.  HISTORY OF PRESENTING ILLNESS:   Lori Sanchez 80 y.o. female is here because of anemia.  This is a very pleasant 80 year old African-American female patient with past medical history significant for temporal arteritis, but not biopsy-proven on chronic prednisone), osteoporosis on alendronate, hypertension, dyslipidemia referred to hematology for evaluation  of so-called iron deficiency anemia.   Interval history  Lori Sanchez is here for a follow up. Since last visit, she tells me that she lost about couple pounds.  She feels very tired, has no appetite and does not want to eat.  She does not understand because she is anemic and losing weight and she is worried if there is any other underlying major disorder.  She had endoscopy and colonoscopy since last visit, she had a colonoscopy paper report which showed a single polyp which has been resected, I could not review endoscopy report.   According to the patient they both looked good and there were no major concerns.  Rest of the pertinent 10 point ROS reviewed and negative.  MEDICAL HISTORY:  Past Medical History:  Diagnosis Date   Arthritis    GERD (gastroesophageal reflux disease)    Hyperlipemia    Hypertension    Osteoporosis    Shingles    Temporal arteritis ([place])     SURGICAL HISTORY: Past Surgical History:  Procedure Laterality Date   ABDOMINAL HYSTERECTOMY  1978   BREAST EXCISIONAL BIOPSY Right 2009   benign   CATARACT EXTRACTION W/ INTRAOCULAR LENS IMPLANT Bilateral 07/2020    SOCIAL HISTORY: Social History   Socioeconomic History   Marital status: Widowed    Spouse name: Not on file   Number of children: 4   Years of education: Not on file   Highest education level: GED or equivalent  Occupational History    Comment: retired  Tobacco Use   Smoking status: Former    Pack years: 0.00   Smokeless tobacco: Never   Tobacco comments:    quit 1985  Substance and Sexual Activity   Alcohol use: No    Comment: quit 1985   Drug use: No   Sexual activity: Yes  Other Topics Concern   Not on file  Social History Narrative   Lives with dgtr, 2 grandchildren   Caffeine- tea 1-2 day   Social Determinants of Health   Financial Resource Strain: Not on file  Food Insecurity: Not on file  Transportation Needs: Not on file  Physical Activity: Not on file  Stress: Not on  file  Social Connections: Not on file  Intimate Partner Violence: Not on file    FAMILY HISTORY: Family History  Problem Relation Age of Onset   Breast cancer Sister 15   Breast cancer Maternal Aunt    Kidney failure Mother    Heart failure Father    Cancer Brother     ALLERGIES:  has No Known Allergies.  MEDICATIONS:  Current Outpatient Medications  Medication Sig Dispense Refill   alendronate (FOSAMAX) 70 MG tablet TAKE 1 TABLET BY MOUTH  WEEKLY 1/2 HOUR BEFORE THE  FIRST FOOD, BEVERAGE OR  MEDICINE OF THE DAY WITH  PLAIN WATER     amLODipine (NORVASC) 5 MG tablet Take 2 tablets (10 mg total) by mouth daily. 20 tablet 0   Iron, Ferrous Sulfate, 325 (65 Fe) MG TABS Take by mouth 2 (two) times daily.     losartan (COZAAR) 100 MG tablet Take 100 mg by mouth daily.     pantoprazole (PROTONIX) 40 MG tablet Take 1 tablet by mouth 2 (two) times daily.     predniSONE (DELTASONE) 5 MG tablet Take 5 mg by mouth daily.     aspirin EC 81 MG tablet Take 81 mg by mouth daily. (Patient not taking: No sig reported)     meclizine (ANTIVERT) 25 MG tablet Take 1 tablet (25 mg total) by mouth 3 (three) times daily as needed for dizziness. (Patient not taking: No sig reported) 15 tablet 0   pravastatin (PRAVACHOL) 40 MG tablet Take 40 mg by mouth daily. (Patient not taking: No sig reported)     valACYclovir (VALTREX) 1000 MG tablet Take 1 tablet (1,000 mg total) by mouth 3 (three) times daily. (Patient not taking: No sig reported) 21 tablet 0   No current facility-administered medications for this visit.     PHYSICAL EXAMINATION: ECOG PERFORMANCE STATUS: 2 - Symptomatic, <50% confined to bed  Vitals:   10/07/20 0939  BP: 116/62  Pulse: 73  Resp: 16  Temp: 98 F (36.7 C)  SpO2: 100%    Filed Weights   10/07/20 0939  Weight: 124 lb (56.2 kg)    GENERAL:alert, no distress and comfortable SKIN: skin color, texture, turgor are normal, no rashes or significant lesions EYES: normal,  conjunctiva are pink and non-injected, sclera clear OROPHARYNX:no exudate, no erythema and lips, buccal mucosa, and tongue normal  NECK: supple, thyroid normal size, non-tender, without nodularity LYMPH: No palpable lymphadenopathy, HEART: regular rate & rhythm and no murmurs and no lower extremity edema ABDOMEN:abdomen soft, non-tender and normal bowel sounds Musculoskeletal:no cyanosis of digits and no clubbing  PSYCH: alert & oriented x 3 with fluent speech NEURO: no focal motor/sensory deficits  LABORATORY DATA:  I have reviewed the data as listed Lab Results  Component Value Date   WBC 5.9 10/07/2020   HGB 7.2 (L) 10/07/2020   HCT 24.3 (L) 10/07/2020   MCV 79.4 (L) 10/07/2020   PLT 540 (H) 10/07/2020     Chemistry  Component Value Date/Time   NA 140 09/23/2020 1117   K 3.9 09/23/2020 1117   CL 104 09/23/2020 1117   CO2 24 09/23/2020 1117   BUN 11 09/23/2020 1117   CREATININE 0.87 09/23/2020 1117      Component Value Date/Time   CALCIUM 9.9 09/23/2020 1117   ALKPHOS 94 09/23/2020 1117   AST 13 (L) 09/23/2020 1117   ALT 10 09/23/2020 1117   BILITOT 0.5 09/23/2020 1117     Labs reviewed No nutritional deficiency No hemolysis SPEP normal.   RADIOGRAPHIC STUDIES: I have personally reviewed the radiological images as listed and agreed with the findings in the report.  No results found.  All questions were answered. The patient knows to call the clinic with any problems, questions or concerns. I spent 30 minutes in the care of the patient including review of prior records, discussion about other causes of anemia, needed diagnostic imaging and bone marrow biopsy.    Benay Pike, MD 10/07/2020 11:07 AM

## 2020-10-07 ENCOUNTER — Other Ambulatory Visit: Payer: Self-pay

## 2020-10-07 ENCOUNTER — Inpatient Hospital Stay: Payer: Medicare Other | Admitting: Hematology and Oncology

## 2020-10-07 ENCOUNTER — Encounter: Payer: Self-pay | Admitting: Hematology and Oncology

## 2020-10-07 ENCOUNTER — Inpatient Hospital Stay: Payer: Medicare Other

## 2020-10-07 VITALS — BP 116/62 | HR 73 | Temp 98.0°F | Resp 16 | Ht 62.0 in | Wt 124.0 lb

## 2020-10-07 DIAGNOSIS — D649 Anemia, unspecified: Secondary | ICD-10-CM

## 2020-10-07 LAB — CBC WITH DIFFERENTIAL/PLATELET
Abs Immature Granulocytes: 0.06 10*3/uL (ref 0.00–0.07)
Basophils Absolute: 0 10*3/uL (ref 0.0–0.1)
Basophils Relative: 1 %
Eosinophils Absolute: 0.2 10*3/uL (ref 0.0–0.5)
Eosinophils Relative: 3 %
HCT: 24.3 % — ABNORMAL LOW (ref 36.0–46.0)
Hemoglobin: 7.2 g/dL — ABNORMAL LOW (ref 12.0–15.0)
Immature Granulocytes: 1 %
Lymphocytes Relative: 11 %
Lymphs Abs: 0.6 10*3/uL — ABNORMAL LOW (ref 0.7–4.0)
MCH: 23.5 pg — ABNORMAL LOW (ref 26.0–34.0)
MCHC: 29.6 g/dL — ABNORMAL LOW (ref 30.0–36.0)
MCV: 79.4 fL — ABNORMAL LOW (ref 80.0–100.0)
Monocytes Absolute: 0.6 10*3/uL (ref 0.1–1.0)
Monocytes Relative: 10 %
Neutro Abs: 4.4 10*3/uL (ref 1.7–7.7)
Neutrophils Relative %: 74 %
Platelets: 540 10*3/uL — ABNORMAL HIGH (ref 150–400)
RBC: 3.06 MIL/uL — ABNORMAL LOW (ref 3.87–5.11)
RDW: 15.9 % — ABNORMAL HIGH (ref 11.5–15.5)
WBC: 5.9 10*3/uL (ref 4.0–10.5)
nRBC: 0 % (ref 0.0–0.2)

## 2020-10-07 LAB — TYPE AND SCREEN
ABO/RH(D): AB POS
Antibody Screen: NEGATIVE

## 2020-10-08 DIAGNOSIS — D120 Benign neoplasm of cecum: Secondary | ICD-10-CM | POA: Diagnosis not present

## 2020-10-08 LAB — IGG, IGA, IGM
IgA: 249 mg/dL (ref 64–422)
IgG (Immunoglobin G), Serum: 1503 mg/dL (ref 586–1602)
IgM (Immunoglobulin M), Srm: 335 mg/dL — ABNORMAL HIGH (ref 26–217)

## 2020-10-08 LAB — KAPPA/LAMBDA LIGHT CHAINS
Kappa free light chain: 58.2 mg/L — ABNORMAL HIGH (ref 3.3–19.4)
Kappa, lambda light chain ratio: 1.79 — ABNORMAL HIGH (ref 0.26–1.65)
Lambda free light chains: 32.6 mg/L — ABNORMAL HIGH (ref 5.7–26.3)

## 2020-10-14 ENCOUNTER — Other Ambulatory Visit: Payer: Self-pay

## 2020-10-14 ENCOUNTER — Ambulatory Visit
Admission: RE | Admit: 2020-10-14 | Discharge: 2020-10-14 | Disposition: A | Discharge status: Home / Self Care | Payer: Medicare Other | Source: Ambulatory Visit | Attending: Hematology and Oncology | Admitting: Hematology and Oncology

## 2020-10-14 DIAGNOSIS — N6489 Other specified disorders of breast: Secondary | ICD-10-CM | POA: Diagnosis not present

## 2020-10-14 DIAGNOSIS — R591 Generalized enlarged lymph nodes: Secondary | ICD-10-CM

## 2020-10-14 DIAGNOSIS — R922 Inconclusive mammogram: Secondary | ICD-10-CM | POA: Diagnosis not present

## 2020-10-16 ENCOUNTER — Encounter: Payer: Medicare Other | Admitting: Diagnostic Neuroimaging

## 2020-10-16 ENCOUNTER — Other Ambulatory Visit: Payer: Self-pay | Admitting: Student

## 2020-10-17 ENCOUNTER — Ambulatory Visit (HOSPITAL_COMMUNITY)
Admission: RE | Admit: 2020-10-17 | Discharge: 2020-10-17 | Disposition: A | Discharge status: Home / Self Care | Payer: Medicare Other | Source: Ambulatory Visit | Attending: Hematology and Oncology | Admitting: Hematology and Oncology

## 2020-10-17 ENCOUNTER — Encounter (HOSPITAL_COMMUNITY): Payer: Self-pay

## 2020-10-17 ENCOUNTER — Observation Stay (HOSPITAL_COMMUNITY)
Admission: RE | Admit: 2020-10-17 | Discharge: 2020-10-17 | Disposition: A | Discharge status: Home / Self Care | Payer: Medicare Other | Source: Ambulatory Visit | Attending: Hematology and Oncology | Admitting: Hematology and Oncology

## 2020-10-17 ENCOUNTER — Other Ambulatory Visit: Payer: Self-pay

## 2020-10-17 DIAGNOSIS — Z7983 Long term (current) use of bisphosphonates: Secondary | ICD-10-CM | POA: Insufficient documentation

## 2020-10-17 DIAGNOSIS — D509 Iron deficiency anemia, unspecified: Secondary | ICD-10-CM | POA: Diagnosis not present

## 2020-10-17 DIAGNOSIS — D72822 Plasmacytosis: Secondary | ICD-10-CM | POA: Insufficient documentation

## 2020-10-17 DIAGNOSIS — D649 Anemia, unspecified: Secondary | ICD-10-CM | POA: Diagnosis not present

## 2020-10-17 DIAGNOSIS — Z7952 Long term (current) use of systemic steroids: Secondary | ICD-10-CM | POA: Insufficient documentation

## 2020-10-17 DIAGNOSIS — D6489 Other specified anemias: Secondary | ICD-10-CM | POA: Diagnosis not present

## 2020-10-17 DIAGNOSIS — D75839 Thrombocytosis, unspecified: Secondary | ICD-10-CM | POA: Insufficient documentation

## 2020-10-17 DIAGNOSIS — Z79899 Other long term (current) drug therapy: Secondary | ICD-10-CM | POA: Diagnosis not present

## 2020-10-17 LAB — CBC WITH DIFFERENTIAL/PLATELET
Abs Immature Granulocytes: 0.05 10*3/uL (ref 0.00–0.07)
Basophils Absolute: 0 10*3/uL (ref 0.0–0.1)
Basophils Relative: 1 %
Eosinophils Absolute: 0.2 10*3/uL (ref 0.0–0.5)
Eosinophils Relative: 3 %
HCT: 25.3 % — ABNORMAL LOW (ref 36.0–46.0)
Hemoglobin: 7.5 g/dL — ABNORMAL LOW (ref 12.0–15.0)
Immature Granulocytes: 1 %
Lymphocytes Relative: 14 %
Lymphs Abs: 0.9 10*3/uL (ref 0.7–4.0)
MCH: 24.3 pg — ABNORMAL LOW (ref 26.0–34.0)
MCHC: 29.6 g/dL — ABNORMAL LOW (ref 30.0–36.0)
MCV: 81.9 fL (ref 80.0–100.0)
Monocytes Absolute: 0.8 10*3/uL (ref 0.1–1.0)
Monocytes Relative: 13 %
Neutro Abs: 4.2 10*3/uL (ref 1.7–7.7)
Neutrophils Relative %: 68 %
Platelets: 444 10*3/uL — ABNORMAL HIGH (ref 150–400)
RBC: 3.09 MIL/uL — ABNORMAL LOW (ref 3.87–5.11)
RDW: 16.6 % — ABNORMAL HIGH (ref 11.5–15.5)
WBC: 6.2 10*3/uL (ref 4.0–10.5)
nRBC: 0 % (ref 0.0–0.2)

## 2020-10-17 MED ORDER — NALOXONE HCL 0.4 MG/ML IJ SOLN
INTRAMUSCULAR | Status: AC
  Filled 2020-10-17: qty 1

## 2020-10-17 MED ORDER — FENTANYL CITRATE (PF) 100 MCG/2ML IJ SOLN
INTRAMUSCULAR | Status: AC
  Filled 2020-10-17: qty 2

## 2020-10-17 MED ORDER — FENTANYL CITRATE (PF) 100 MCG/2ML IJ SOLN
INTRAMUSCULAR | Status: AC | PRN
  Administered 2020-10-17: 50 ug via INTRAVENOUS
  Administered 2020-10-17: 25 ug via INTRAVENOUS

## 2020-10-17 MED ORDER — MIDAZOLAM HCL 2 MG/2ML IJ SOLN
INTRAMUSCULAR | Status: AC
  Filled 2020-10-17: qty 2

## 2020-10-17 MED ORDER — LIDOCAINE HCL (PF) 1 % IJ SOLN
INTRAMUSCULAR | Status: AC | PRN
  Administered 2020-10-17: 10 mL via INTRADERMAL

## 2020-10-17 MED ORDER — FLUMAZENIL 0.5 MG/5ML IV SOLN
INTRAVENOUS | Status: AC
  Filled 2020-10-17: qty 5

## 2020-10-17 MED ORDER — SODIUM CHLORIDE 0.9 % IV SOLN: INTRAVENOUS | Status: DC

## 2020-10-17 MED ORDER — MIDAZOLAM HCL 2 MG/2ML IJ SOLN
INTRAMUSCULAR | Status: AC | PRN
  Administered 2020-10-17: 1 mg via INTRAVENOUS

## 2020-10-17 NOTE — Discharge Instructions (Signed)
10/17/2020   Procedure-Bone Biopsy   The sedative medications given during your procedure may have effects lasting up to 24 hours.  You may have some drowsiness, dizziness, difficulty with walking, making decisions and memory problems.  No activities should be taken that require your best effort during the next 24 hours.  For your safety it is important that you understand and follow these instructions.  Do not drive any form of motorized equipment such as cars, trucks, yard tractors, golf carts, motorcycles and mopeds. Do not drink alcohol. Do not operate machinery or equipment such as stove, lawnmower and chainsaw. Do not make any important decisions such as signing any legal documents. You may become dizzy, especially when changing positions.  Move slowly and take your time.  Avoid stairs if possible. Resume your normal diet when you feel ready to eat.  If you feel queasy, drink small amounts of uncarbonated clear liquids such as tea, orange or apple juice, clear broth or Gatorade to avoid getting dehydrated.  Then progress to bland soup, toast or crackers, then resume your normal diet again.  Additional instructions to follow include: none    These instructions have been explained and understood by me or by a responsible person.  I have received a copy to take home with me.  I have a responsible adult to drive me home and stay with me when I return home.  All of my questions have been answered for me or for a responsible person. Biopsy Discharge Instructions  The procedure you just had is called a biopsy.  You may feel some discomfort after the local anesthetic wears off.  Your discomfort should improve over the next several days.  AFTER YOUR BIOPSY Rest for the remainder of the day. Avoid heavy lifting (more than 10 lb/4.5 kg). If you have been given a general anesthetic or other medications to help you relax, you should not operate machinery, drive or make legal decisions for 24 hours  after your procedure.  Additionally, someone must be available to drive you home. Only take over-the-counter or prescription medicines for pain, discomfort, or fever as directed by your caregiver.  This can make bleeding worse. You may resume your usual diet after the procedure. Avoid alcoholic beverages for 24 hours after your procedure. Keep the skin around your biopsy site clean and dry. You may shower after 24 hours.  Cleanse and dry the biopsy site completely after you shower.  Avoid baths and swimming for 72 hours.  Complications are very uncommon after this procedure.  Go to the nearest Emergency Department or contact your caregiver if you develop any of the following symptoms: Worsening pain Bleeding Swelling at the biopsy site Light headedness or dizziness Shortness of Breath Fever or chills Redness or increased pain or swelling at the biopsy site

## 2020-10-17 NOTE — H&P (Signed)
Chief Complaint: Patient was seen in consultation today for bone marrow biopsy at the request of [place]  Referring Physician(s): Iruku,Praveena  Supervising Physician: Sandi Mariscal  Patient Status: [facility] - Out-pt  History of Present Illness: Lori Sanchez is a 80 y.o. female being worked up for anemia. She is referred for bone marrow biopsy PMHx, meds, labs, imaging, allergies reviewed. Feels well, no recent fevers, chills, illness. Has been NPO today as directed.   Past Medical History:  Diagnosis Date   Arthritis    GERD (gastroesophageal reflux disease)    Hyperlipemia    Hypertension    Osteoporosis    Shingles    Temporal arteritis ([place])     Past Surgical History:  Procedure Laterality Date   ABDOMINAL HYSTERECTOMY  1978   BREAST EXCISIONAL BIOPSY Right 2009   benign   CATARACT EXTRACTION W/ INTRAOCULAR LENS IMPLANT Bilateral 07/2020    Allergies: Patient has no known allergies.  Medications: Prior to Admission medications   Medication Sig Start Date End Date Taking? Authorizing Provider  amLODipine (NORVASC) 5 MG tablet Take 2 tablets (10 mg total) by mouth daily. 09/02/11  Yes [name], MD  Iron, Ferrous Sulfate, 325 (65 Fe) MG TABS Take by mouth 2 (two) times daily.   Yes [provider]  losartan (COZAAR) 100 MG tablet Take 100 mg by mouth daily.   Yes [provider]  pantoprazole (PROTONIX) 40 MG tablet Take 1 tablet by mouth 2 (two) times daily. 08/28/20  Yes [provider]  predniSONE (DELTASONE) 5 MG tablet Take 5 mg by mouth daily.   Yes [provider]  alendronate (FOSAMAX) 70 MG tablet TAKE 1 TABLET BY MOUTH  WEEKLY 1/2 HOUR BEFORE THE  FIRST FOOD, BEVERAGE OR  MEDICINE OF THE DAY WITH  PLAIN WATER    [provider]  aspirin EC 81 MG tablet Take 81 mg by mouth daily. Patient not taking: No sig reported    [provider]  meclizine (ANTIVERT) 25 MG tablet Take 1 tablet (25  mg total) by mouth 3 (three) times daily as needed for dizziness. Patient not taking: No sig reported 11/10/15   [name], MD  pravastatin (PRAVACHOL) 40 MG tablet Take 40 mg by mouth daily. Patient not taking: No sig reported    [provider]  valACYclovir (VALTREX) 1000 MG tablet Take 1 tablet (1,000 mg total) by mouth 3 (three) times daily. Patient not taking: No sig reported 03/02/20   Lacretia Leigh, MD     Family History  Problem Relation Age of Onset   Breast cancer Sister 34   Breast cancer Maternal Aunt    Kidney failure Mother    Heart failure Father    Cancer Brother     Social History   Socioeconomic History   Marital status: Widowed    Spouse name: Not on file   Number of children: 4   Years of education: Not on file   Highest education level: GED or equivalent  Occupational History    Comment: retired  Tobacco Use   Smoking status: Former    Pack years: 0.00   Smokeless tobacco: Never   Tobacco comments:    quit 1985  Vaping Use   Vaping Use: Never used  Substance and Sexual Activity   Alcohol use: No    Comment: quit 1985   Drug use: No   Sexual activity: Yes  Other Topics Concern   Not on file  Social History Narrative  Lives with dgtr, 2 grandchildren   Caffeine- tea 1-2 day   Social Determinants of Health   Financial Resource Strain: Not on file  Food Insecurity: Not on file  Transportation Needs: Not on file  Physical Activity: Not on file  Stress: Not on file  Social Connections: Not on file    Review of Systems: A 12 point ROS discussed and pertinent positives are indicated in the HPI above.  All other systems are negative.  Review of Systems  Vital Signs: BP (!) 153/84   Pulse 72   Temp 97.9 F (36.6 C) (Oral)   Resp 16   SpO2 100%   Physical Exam Constitutional:      Appearance: Normal appearance.  HENT:     Mouth/Throat:     Mouth: Mucous membranes are moist.  Cardiovascular:     Rate and Rhythm:  Normal rate and regular rhythm.     Heart sounds: Normal heart sounds.  Pulmonary:     Effort: Pulmonary effort is normal. No respiratory distress.     Breath sounds: Normal breath sounds.  Skin:    General: Skin is warm and dry.  Neurological:     General: No focal deficit present.     Mental Status: She is alert and oriented to person, place, and time.  Psychiatric:        Mood and Affect: Mood normal.        Thought Content: Thought content normal.        Judgment: Judgment normal.    Imaging: MM DIAG BREAST TOMO BILATERAL  Result Date: 10/14/2020 CLINICAL DATA:  LEFT axillary fullness, possible adenopathy. EXAM: DIGITAL DIAGNOSTIC BILATERAL MAMMOGRAM WITH TOMOSYNTHESIS AND CAD; [place] AXILLARY LEFT TECHNIQUE: Bilateral digital diagnostic mammography and breast tomosynthesis was performed. The images were evaluated with computer-aided detection.; Targeted ultrasound examination of the left axilla was performed. COMPARISON:  Previous exam(s). ACR Breast Density Category b: There are scattered areas of fibroglandular density. FINDINGS: Bilateral diagnostic mammogram: There are no new dominant masses, suspicious calcifications or secondary signs of malignancy within either breast. No enlarged lymph nodes identified within the visualized portions of each lower axillary region. Targeted ultrasound is performed, evaluating the LEFT axilla, showing several normal-appearing lymph nodes. No enlarged lymph nodes. No solid or cystic mass within the LEFT axilla. IMPRESSION: 1. No evidence of malignancy within either breast. 2. No lymphadenopathy within the LEFT axilla. RECOMMENDATION: Screening mammogram in one year.(Code:SM-B-01Y) I have discussed the findings and recommendations with the patient. If applicable, a reminder letter will be sent to the patient regarding the next appointment. BI-RADS CATEGORY  1: Negative. Electronically Signed   By: Franki Cabot M.D.   On: 10/14/2020 13:08   [place] AXILLA  LEFT  Result Date: 10/14/2020 CLINICAL DATA:  LEFT axillary fullness, possible adenopathy. EXAM: DIGITAL DIAGNOSTIC BILATERAL MAMMOGRAM WITH TOMOSYNTHESIS AND CAD; [place] AXILLARY LEFT TECHNIQUE: Bilateral digital diagnostic mammography and breast tomosynthesis was performed. The images were evaluated with computer-aided detection.; Targeted ultrasound examination of the left axilla was performed. COMPARISON:  Previous exam(s). ACR Breast Density Category b: There are scattered areas of fibroglandular density. FINDINGS: Bilateral diagnostic mammogram: There are no new dominant masses, suspicious calcifications or secondary signs of malignancy within either breast. No enlarged lymph nodes identified within the visualized portions of each lower axillary region. Targeted ultrasound is performed, evaluating the LEFT axilla, showing several normal-appearing lymph nodes. No enlarged lymph nodes. No solid or cystic mass within the LEFT axilla. IMPRESSION: 1. No evidence of malignancy within  either breast. 2. No lymphadenopathy within the LEFT axilla. RECOMMENDATION: Screening mammogram in one year.(Code:SM-B-01Y) I have discussed the findings and recommendations with the patient. If applicable, a reminder letter will be sent to the patient regarding the next appointment. BI-RADS CATEGORY  1: Negative. Electronically Signed   By: Franki Cabot M.D.   On: 10/14/2020 13:08   Labs:  CBC: Recent Labs    09/23/20 1117 10/07/20 1026 10/17/20 0716  WBC 7.7 5.9 6.2  HGB 7.1* 7.2* 7.5*  HCT 24.0*  24.3* 24.3* 25.3*  PLT 536* 540* 444*    COAGS: No results for input(s): INR, APTT in the last 8760 hours.  BMP: Recent Labs    09/23/20 1117  NA 140  K 3.9  CL 104  CO2 24  GLUCOSE 91  BUN 11  CALCIUM 9.9  CREATININE 0.87  GFRNONAA >60    LIVER FUNCTION TESTS: Recent Labs    09/23/20 1117  BILITOT 0.5  AST 13*  ALT 10  ALKPHOS 94  PROT 8.3*  ALBUMIN 2.8*    TUMOR MARKERS: No results for  input(s): AFPTM, CEA, CA199, CHROMGRNA in the last 8760 hours.  Assessment and Plan: Anemia For CT guided bone marrow biopsy today Risks and benefits of bone marrow biopsy was discussed with the patient and/or patient's family including, but not limited to bleeding, infection, damage to adjacent structures or low yield requiring additional tests.  All of the questions were answered and there is agreement to proceed.  Consent signed and in chart.   Thank you for this interesting consult.  I greatly enjoyed meeting Lori Sanchez and look forward to participating in their care.  A copy of this report was sent to the requesting provider on this date.  Electronically Signed: Ascencion Dike, PA-C 10/17/2020, 8:12 AM   I spent a total of 20 minutes in face to face in clinical consultation, greater than 50% of which was counseling/coordinating care for bone marrow bx

## 2020-10-17 NOTE — Procedures (Signed)
Pre-procedure Diagnosis: Anemia of uncertain etiology Post-procedure Diagnosis: Same  Technically successful CT guided bone marrow aspiration and biopsy of left iliac crest.   Complications: None Immediate  EBL: None  Signed: Sandi Mariscal Pager: [phone] 10/17/2020, 9:45 AM

## 2020-10-21 ENCOUNTER — Other Ambulatory Visit: Payer: Self-pay

## 2020-10-21 ENCOUNTER — Ambulatory Visit (HOSPITAL_COMMUNITY)
Admission: RE | Admit: 2020-10-21 | Discharge: 2020-10-21 | Disposition: A | Discharge status: Home / Self Care | Payer: Medicare Other | Source: Ambulatory Visit | Attending: Hematology and Oncology | Admitting: Hematology and Oncology

## 2020-10-21 DIAGNOSIS — D649 Anemia, unspecified: Secondary | ICD-10-CM | POA: Insufficient documentation

## 2020-10-21 DIAGNOSIS — E042 Nontoxic multinodular goiter: Secondary | ICD-10-CM | POA: Insufficient documentation

## 2020-10-21 DIAGNOSIS — I700 Atherosclerosis of aorta: Secondary | ICD-10-CM | POA: Diagnosis not present

## 2020-10-21 DIAGNOSIS — I2510 Atherosclerotic heart disease of native coronary artery without angina pectoris: Secondary | ICD-10-CM | POA: Diagnosis not present

## 2020-10-21 DIAGNOSIS — K5730 Diverticulosis of large intestine without perforation or abscess without bleeding: Secondary | ICD-10-CM | POA: Insufficient documentation

## 2020-10-21 DIAGNOSIS — K5750 Diverticulosis of both small and large intestine without perforation or abscess without bleeding: Secondary | ICD-10-CM | POA: Diagnosis not present

## 2020-10-21 DIAGNOSIS — R918 Other nonspecific abnormal finding of lung field: Secondary | ICD-10-CM | POA: Insufficient documentation

## 2020-10-21 DIAGNOSIS — K769 Liver disease, unspecified: Secondary | ICD-10-CM | POA: Diagnosis not present

## 2020-10-21 DIAGNOSIS — R634 Abnormal weight loss: Secondary | ICD-10-CM | POA: Insufficient documentation

## 2020-10-21 MED ORDER — SODIUM CHLORIDE (PF) 0.9 % IJ SOLN
INTRAMUSCULAR | Status: AC
  Filled 2020-10-21: qty 50

## 2020-10-21 MED ORDER — IOHEXOL 300 MG/ML  SOLN
75.0000 mL | Freq: Once | INTRAMUSCULAR | Status: AC | PRN
  Administered 2020-10-21: 75 mL via INTRAVENOUS

## 2020-10-22 ENCOUNTER — Telehealth: Payer: Self-pay

## 2020-10-22 ENCOUNTER — Inpatient Hospital Stay: Payer: Medicare Other | Admitting: Hematology and Oncology

## 2020-10-22 ENCOUNTER — Telehealth: Payer: Self-pay | Admitting: Hematology and Oncology

## 2020-10-22 NOTE — Telephone Encounter (Signed)
Spoke with patient's son regarding missed appointment from today, 7/6. Message sent to scheduling to reschedule. Verbalized understanding.

## 2020-10-22 NOTE — Telephone Encounter (Signed)
R/s appt per 7/6 sch msg. Pt aware.  

## 2020-10-23 DIAGNOSIS — D649 Anemia, unspecified: Secondary | ICD-10-CM | POA: Diagnosis not present

## 2020-10-23 DIAGNOSIS — M150 Primary generalized (osteo)arthritis: Secondary | ICD-10-CM | POA: Diagnosis not present

## 2020-10-23 DIAGNOSIS — R29898 Other symptoms and signs involving the musculoskeletal system: Secondary | ICD-10-CM | POA: Diagnosis not present

## 2020-10-23 DIAGNOSIS — M316 Other giant cell arteritis: Secondary | ICD-10-CM | POA: Diagnosis not present

## 2020-10-23 DIAGNOSIS — M810 Age-related osteoporosis without current pathological fracture: Secondary | ICD-10-CM | POA: Diagnosis not present

## 2020-10-23 DIAGNOSIS — R5383 Other fatigue: Secondary | ICD-10-CM | POA: Diagnosis not present

## 2020-10-27 ENCOUNTER — Encounter (HOSPITAL_COMMUNITY): Payer: Self-pay | Admitting: Hematology and Oncology

## 2020-10-29 ENCOUNTER — Other Ambulatory Visit: Payer: Self-pay

## 2020-10-29 ENCOUNTER — Inpatient Hospital Stay: Payer: Medicare Other

## 2020-10-29 ENCOUNTER — Inpatient Hospital Stay: Payer: Medicare Other | Attending: Hematology and Oncology | Admitting: Hematology and Oncology

## 2020-10-29 VITALS — BP 152/79 | HR 80 | Temp 98.6°F | Resp 17 | Wt 119.7 lb

## 2020-10-29 DIAGNOSIS — E041 Nontoxic single thyroid nodule: Secondary | ICD-10-CM

## 2020-10-29 DIAGNOSIS — D649 Anemia, unspecified: Secondary | ICD-10-CM | POA: Insufficient documentation

## 2020-10-29 DIAGNOSIS — E042 Nontoxic multinodular goiter: Secondary | ICD-10-CM | POA: Insufficient documentation

## 2020-10-29 LAB — CBC WITH DIFFERENTIAL/PLATELET
Abs Immature Granulocytes: 0.04 10*3/uL (ref 0.00–0.07)
Basophils Absolute: 0.1 10*3/uL (ref 0.0–0.1)
Basophils Relative: 1 %
Eosinophils Absolute: 0.2 10*3/uL (ref 0.0–0.5)
Eosinophils Relative: 4 %
HCT: 26.9 % — ABNORMAL LOW (ref 36.0–46.0)
Hemoglobin: 7.9 g/dL — ABNORMAL LOW (ref 12.0–15.0)
Immature Granulocytes: 1 %
Lymphocytes Relative: 10 %
Lymphs Abs: 0.7 10*3/uL (ref 0.7–4.0)
MCH: 23.5 pg — ABNORMAL LOW (ref 26.0–34.0)
MCHC: 29.4 g/dL — ABNORMAL LOW (ref 30.0–36.0)
MCV: 80.1 fL (ref 80.0–100.0)
Monocytes Absolute: 0.7 10*3/uL (ref 0.1–1.0)
Monocytes Relative: 11 %
Neutro Abs: 4.8 10*3/uL (ref 1.7–7.7)
Neutrophils Relative %: 73 %
Platelets: 537 10*3/uL — ABNORMAL HIGH (ref 150–400)
RBC: 3.36 MIL/uL — ABNORMAL LOW (ref 3.87–5.11)
RDW: 17.2 % — ABNORMAL HIGH (ref 11.5–15.5)
WBC: 6.5 10*3/uL (ref 4.0–10.5)
nRBC: 0 % (ref 0.0–0.2)

## 2020-10-29 NOTE — Progress Notes (Signed)
[name] CONSULT NOTE  Patient Care Team: [name], MD as PCP - General (Internal Medicine)  CHIEF COMPLAINTS/PURPOSE OF CONSULTATION:  Severe anemia follow up  ASSESSMENT & PLAN:   [name] who was referred to hematology for evaluation of possible iron deficiency anemia. During her initial visit, she was noted to have normocytic normochromic anemia, reported black stools, weight loss along with severe anemia, she had endoscopy and colonoscopy which according to the patient were unremarkable. She does not have any evidence of myeloma protein on serum protein electrophoresis, creatinine is normal, no concerns for active myeloma. No evidence of nutritional deficiency or hemolysis. I have recommended that we proceed with systemic imaging given the unexplained weight loss, severe anemia as well as also proceed with bone marrow aspiration and biopsy She is here for FU. Systemic imaging showed thyroid nodules and a recommendation was to do consider thyroid biopsy. BMB showed no clear evidence of myelodysplasia, cytogenetics normal. Hypercellular marrow for her age. I do believe she may have early myelodysplasia, she feels better today No immediate indication for transfusion. I recommended to proceed with US thyroid and RTC in 4 weeks. She was encouraged to call me with any questions or concerns.   HISTORY OF PRESENTING ILLNESS:   Lori Sanchez 80 y.o. female is here because of anemia.  This is a very pleasant 79 year old African-American female patient with past medical history significant for temporal arteritis, but not biopsy-proven on chronic prednisone), osteoporosis on alendronate, hypertension, dyslipidemia referred to hematology for evaluation of so-called iron deficiency anemia.   Interval history  Lori Sanchez is here for a follow up. Since last visit, she had imaging and BMB She feels a bit better she says. No more  black stool She had endoscopy and colonoscopy recently and these were unremarkable according to patient. Rest of the pertinent 10 point ROS reviewed and negative.  MEDICAL HISTORY:  Past Medical History:  Diagnosis Date   Arthritis    GERD (gastroesophageal reflux disease)    Hyperlipemia    Hypertension    Osteoporosis    Shingles    Temporal arteritis ([place])     SURGICAL HISTORY: Past Surgical History:  Procedure Laterality Date   ABDOMINAL HYSTERECTOMY  1978   BREAST EXCISIONAL BIOPSY Right 2009   benign   CATARACT EXTRACTION W/ INTRAOCULAR LENS IMPLANT Bilateral 07/2020    SOCIAL HISTORY: Social History   Socioeconomic History   Marital status: Widowed    Spouse name: Not on file   Number of children: 4   Years of education: Not on file   Highest education level: GED or equivalent  Occupational History    Comment: retired  Tobacco Use   Smoking status: Former    Pack years: 0.00   Smokeless tobacco: Never   Tobacco comments:    quit 1985  Vaping Use   Vaping Use: Never used  Substance and Sexual Activity   Alcohol use: No    Comment: quit 1985   Drug use: No   Sexual activity: Yes  Other Topics Concern   Not on file  Social History Narrative   Lives with dgtr, 2 grandchildren   Caffeine- tea 1-2 day   Social Determinants of Health   Financial Resource Strain: Not on file  Food Insecurity: Not on file  Transportation Needs: Not on file  Physical Activity: Not on file  Stress: Not on file  Social Connections: Not on file  Intimate Partner Violence: Not  on file    FAMILY HISTORY: Family History  Problem Relation Age of Onset   Breast cancer Sister 83   Breast cancer Maternal Aunt    Kidney failure Mother    Heart failure Father    Cancer Brother     ALLERGIES:  has No Known Allergies.  MEDICATIONS:  Current Outpatient Medications  Medication Sig Dispense Refill   alendronate (FOSAMAX) 70 MG tablet TAKE 1 TABLET BY MOUTH  WEEKLY 1/2 HOUR  BEFORE THE  FIRST FOOD, BEVERAGE OR  MEDICINE OF THE DAY WITH  PLAIN WATER     amLODipine (NORVASC) 5 MG tablet Take 2 tablets (10 mg total) by mouth daily. 20 tablet 0   aspirin EC 81 MG tablet Take 81 mg by mouth daily. (Patient not taking: No sig reported)     Iron, Ferrous Sulfate, 325 (65 Fe) MG TABS Take by mouth 2 (two) times daily.     losartan (COZAAR) 100 MG tablet Take 100 mg by mouth daily.     meclizine (ANTIVERT) 25 MG tablet Take 1 tablet (25 mg total) by mouth 3 (three) times daily as needed for dizziness. (Patient not taking: No sig reported) 15 tablet 0   pantoprazole (PROTONIX) 40 MG tablet Take 1 tablet by mouth 2 (two) times daily.     pravastatin (PRAVACHOL) 40 MG tablet Take 40 mg by mouth daily. (Patient not taking: No sig reported)     predniSONE (DELTASONE) 5 MG tablet Take 5 mg by mouth daily.     valACYclovir (VALTREX) 1000 MG tablet Take 1 tablet (1,000 mg total) by mouth 3 (three) times daily. (Patient not taking: No sig reported) 21 tablet 0   No current facility-administered medications for this visit.     PHYSICAL EXAMINATION: ECOG PERFORMANCE STATUS: 2 - Symptomatic, <50% confined to bed  Vitals:   10/29/20 1523  BP: (!) 152/79  Pulse: 80  Resp: 17  Temp: 98.6 F (37 C)  SpO2: 100%    Filed Weights   10/29/20 1523  Weight: 119 lb 11.2 oz (54.3 kg)    PE deferred in counseling  LABORATORY DATA:  I have reviewed the data as listed Lab Results  Component Value Date   WBC 6.5 10/29/2020   HGB 7.9 (L) 10/29/2020   HCT 26.9 (L) 10/29/2020   MCV 80.1 10/29/2020   PLT 537 (H) 10/29/2020     Chemistry      Component Value Date/Time   NA 140 09/23/2020 1117   K 3.9 09/23/2020 1117   CL 104 09/23/2020 1117   CO2 24 09/23/2020 1117   BUN 11 09/23/2020 1117   CREATININE 0.87 09/23/2020 1117      Component Value Date/Time   CALCIUM 9.9 09/23/2020 1117   ALKPHOS 94 09/23/2020 1117   AST 13 (L) 09/23/2020 1117   ALT 10 09/23/2020 1117    BILITOT 0.5 09/23/2020 1117     Labs reviewed No nutritional deficiency No hemolysis SPEP normal.   RADIOGRAPHIC STUDIES: I have personally reviewed the radiological images as listed and agreed with the findings in the report.  CT CHEST ABDOMEN PELVIS W CONTRAST  Result Date: 10/21/2020 CLINICAL DATA:  Unexplained weight loss, severe anemia, evaluation of occult malignancy EXAM: CT CHEST, ABDOMEN, AND PELVIS WITH CONTRAST TECHNIQUE: Multidetector CT imaging of the chest, abdomen and pelvis was performed following the standard protocol during bolus administration of intravenous contrast. CONTRAST:  72m OMNIPAQUE IOHEXOL 300 MG/ML  SOLN COMPARISON:  None. FINDINGS: CT CHEST FINDINGS Cardiovascular:  Aortic atherosclerosis without aneurysmal dilation. No central pulmonary embolus. Coronary artery calcifications. Normal size heart. No significant pericardial effusion/thickening. Mediastinum/Nodes: Multiple thyroid nodules the largest of which is in the left lobe of the thyroid measuring 18 mm on image 7/2. No supraclavicular adenopathy. No pathologically enlarged mediastinal, hilar or axillary lymph nodes. The trachea and esophagus are grossly unremarkable. Lungs/Pleura: There are few scattered tiny pulmonary nodules measuring up to 2-3 mm for instance in the left lower lobe on image 49/4, in the right upper lobe on image 52/4 and in the right lower lobe on image 59/4. Hypoventilatory change in the dependent lungs. No focal airspace consolidation. No pleural effusion. No pneumothorax. Musculoskeletal: Multilevel degenerative change of the spine including multiple Schmorl's nodes. No aggressive lytic or blastic lesion of bone. CT ABDOMEN PELVIS FINDINGS Hepatobiliary: Hypodense 7 mm lesion in the peripheral right lobe of the liver on image 45/2, which is technically too small to accurately characterize but statistically most likely to represent a cyst. No solid enhancing hepatic lesions. Gallbladder is  decompressed otherwise unremarkable. No biliary ductal dilation. Pancreas: Prominence of the pancreatic duct in the head of the pancreas without frank dilation. No surrounding inflammatory change. No discrete pancreatic lesion visualized. Spleen: Within normal limits. Adrenals/Urinary Tract: Bilateral adrenal glands are unremarkable. No hydronephrosis. Bilateral renal scarring. Otherwise symmetric enhancement and excretion of contrast from the bilateral kidneys. No solid enhancing renal masses. Urinary bladder is grossly unremarkable for degree of distension. Stomach/Bowel: Radiopaque enteric contrast traverses the hepatic flexure. Stomach is grossly unremarkable for degree of distension. No pathologic dilation small bowel. The appendix and terminal ileum are grossly unremarkable. Colonic diverticulosis without findings of acute diverticulitis. Vascular/Lymphatic: Aortic atherosclerosis without aneurysmal dilation. No pathologically enlarged abdominal or pelvic lymph nodes. Reproductive: Status post hysterectomy. No adnexal masses. Other: No abdominopelvic ascites. Musculoskeletal: Left iliac bone biopsy tract. Multilevel degenerative changes spine. Degenerative changes bilateral hips and SI joints. No acute osseous abnormality. IMPRESSION: 1. Multiple thyroid nodules measuring up to 18 mm. Recommend further evaluation with thyroid US (ref: J Am Coll Radiol. 2015 Feb;12(2): 143-50). 2. Scattered bilateral pulmonary nodules measuring up to 2-3 mm. No follow-up needed if patient is low-risk (and has no known or suspected primary neoplasm). Non-contrast chest CT can be considered in 12 months if patient is high-risk. This recommendation follows the consensus statement: Guidelines for Management of Incidental Pulmonary Nodules Detected on CT Images: From the Fleischner Society 2017; Radiology 2017; 284:228-243. 3. Colonic diverticulosis without findings of acute diverticulitis. 4. Hypodense 7 mm lesion in the peripheral  right lobe of the liver which is technically too small to accurately characterize but statistically most likely to represent a cyst. 5.  Aortic Atherosclerosis (ICD10-I70.0). Electronically Signed   By: Dahlia Bailiff MD   On: 10/21/2020 14:21   CT BIOPSY  Result Date: 10/17/2020 INDICATION: Anemia of uncertain etiology. Please perform CT-guided bone marrow biopsy for tissue diagnostic purposes. EXAM: CT-GUIDED BONE MARROW BIOPSY AND ASPIRATION MEDICATIONS: None ANESTHESIA/SEDATION: Fentanyl 75 mcg IV; Versed 1 mg IV Sedation Time: 10 Minutes; The patient was continuously monitored during the procedure by the interventional radiology nurse under my direct supervision. COMPLICATIONS: None immediate. PROCEDURE: Informed consent was obtained from the patient following an explanation of the procedure, risks, benefits and alternatives. The patient understands, agrees and consents for the procedure. All questions were addressed. A time out was performed prior to the initiation of the procedure. The patient was positioned prone and non-contrast localization CT was performed of the pelvis to demonstrate  the iliac marrow spaces. The operative site was prepped and draped in the usual sterile fashion. Under sterile conditions and local anesthesia, a 22 gauge spinal needle was utilized for procedural planning. Next, an 11 gauge coaxial bone biopsy needle was advanced into the left iliac marrow space. Needle position was confirmed with CT imaging. Initially, a bone marrow aspiration was performed. Next, a bone marrow biopsy was obtained with the 11 gauge outer bone marrow device. Samples were prepared with the cytotechnologist and deemed adequate. The needle was removed and superficial hemostasis was obtained with manual compression. A dressing was applied. The patient tolerated the procedure well without immediate post procedural complication. IMPRESSION: Successful CT guided left iliac bone marrow aspiration and core  biopsy. Electronically Signed   By: Sandi Mariscal M.D.   On: 10/17/2020 11:46   CT BONE MARROW BIOPSY & ASPIRATION  Result Date: 10/17/2020 INDICATION: Anemia of uncertain etiology. Please perform CT-guided bone marrow biopsy for tissue diagnostic purposes. EXAM: CT-GUIDED BONE MARROW BIOPSY AND ASPIRATION MEDICATIONS: None ANESTHESIA/SEDATION: Fentanyl 75 mcg IV; Versed 1 mg IV Sedation Time: 10 Minutes; The patient was continuously monitored during the procedure by the interventional radiology nurse under my direct supervision. COMPLICATIONS: None immediate. PROCEDURE: Informed consent was obtained from the patient following an explanation of the procedure, risks, benefits and alternatives. The patient understands, agrees and consents for the procedure. All questions were addressed. A time out was performed prior to the initiation of the procedure. The patient was positioned prone and non-contrast localization CT was performed of the pelvis to demonstrate the iliac marrow spaces. The operative site was prepped and draped in the usual sterile fashion. Under sterile conditions and local anesthesia, a 22 gauge spinal needle was utilized for procedural planning. Next, an 11 gauge coaxial bone biopsy needle was advanced into the left iliac marrow space. Needle position was confirmed with CT imaging. Initially, a bone marrow aspiration was performed. Next, a bone marrow biopsy was obtained with the 11 gauge outer bone marrow device. Samples were prepared with the cytotechnologist and deemed adequate. The needle was removed and superficial hemostasis was obtained with manual compression. A dressing was applied. The patient tolerated the procedure well without immediate post procedural complication. IMPRESSION: Successful CT guided left iliac bone marrow aspiration and core biopsy. Electronically Signed   By: Sandi Mariscal M.D.   On: 10/17/2020 11:46   MM DIAG BREAST TOMO BILATERAL  Result Date: 10/14/2020 CLINICAL  DATA:  LEFT axillary fullness, possible adenopathy. EXAM: DIGITAL DIAGNOSTIC BILATERAL MAMMOGRAM WITH TOMOSYNTHESIS AND CAD; [place] AXILLARY LEFT TECHNIQUE: Bilateral digital diagnostic mammography and breast tomosynthesis was performed. The images were evaluated with computer-aided detection.; Targeted ultrasound examination of the left axilla was performed. COMPARISON:  Previous exam(s). ACR Breast Density Category b: There are scattered areas of fibroglandular density. FINDINGS: Bilateral diagnostic mammogram: There are no new dominant masses, suspicious calcifications or secondary signs of malignancy within either breast. No enlarged lymph nodes identified within the visualized portions of each lower axillary region. Targeted ultrasound is performed, evaluating the LEFT axilla, showing several normal-appearing lymph nodes. No enlarged lymph nodes. No solid or cystic mass within the LEFT axilla. IMPRESSION: 1. No evidence of malignancy within either breast. 2. No lymphadenopathy within the LEFT axilla. RECOMMENDATION: Screening mammogram in one year.(Code:[id number]) I have discussed the findings and recommendations with the patient. If applicable, a reminder letter will be sent to the patient regarding the next appointment. BI-RADS CATEGORY  1: Negative. Electronically Signed   By: Cherlynn Kaiser  Enriqueta Shutter M.D.   On: 10/14/2020 13:08   [place] AXILLA LEFT  Result Date: 10/14/2020 CLINICAL DATA:  LEFT axillary fullness, possible adenopathy. EXAM: DIGITAL DIAGNOSTIC BILATERAL MAMMOGRAM WITH TOMOSYNTHESIS AND CAD; [place] AXILLARY LEFT TECHNIQUE: Bilateral digital diagnostic mammography and breast tomosynthesis was performed. The images were evaluated with computer-aided detection.; Targeted ultrasound examination of the left axilla was performed. COMPARISON:  Previous exam(s). ACR Breast Density Category b: There are scattered areas of fibroglandular density. FINDINGS: Bilateral diagnostic mammogram: There are no new dominant masses,  suspicious calcifications or secondary signs of malignancy within either breast. No enlarged lymph nodes identified within the visualized portions of each lower axillary region. Targeted ultrasound is performed, evaluating the LEFT axilla, showing several normal-appearing lymph nodes. No enlarged lymph nodes. No solid or cystic mass within the LEFT axilla. IMPRESSION: 1. No evidence of malignancy within either breast. 2. No lymphadenopathy within the LEFT axilla. RECOMMENDATION: Screening mammogram in one year.(Code:[id number]) I have discussed the findings and recommendations with the patient. If applicable, a reminder letter will be sent to the patient regarding the next appointment. BI-RADS CATEGORY  1: Negative. Electronically Signed   By: Franki Cabot M.D.   On: 10/14/2020 13:08   All questions were answered. The patient knows to call the clinic with any problems, questions or concerns. We reviewed BMB findings and imaging findings. I spent 20 minutes in the care of the patient including History, review of records, counseling and coordination of care.    Benay Pike, MD 10/29/2020 9:46 PM

## 2020-10-30 ENCOUNTER — Encounter: Payer: Self-pay | Admitting: Hematology and Oncology

## 2020-11-03 ENCOUNTER — Encounter (HOSPITAL_COMMUNITY): Payer: Self-pay | Admitting: Hematology and Oncology

## 2020-11-05 ENCOUNTER — Ambulatory Visit (HOSPITAL_COMMUNITY)
Admission: RE | Admit: 2020-11-05 | Discharge: 2020-11-05 | Disposition: A | Discharge status: Home / Self Care | Payer: Medicare Other | Source: Ambulatory Visit | Attending: Hematology and Oncology | Admitting: Hematology and Oncology

## 2020-11-05 ENCOUNTER — Other Ambulatory Visit: Payer: Self-pay

## 2020-11-05 ENCOUNTER — Other Ambulatory Visit: Payer: Self-pay | Admitting: Hematology and Oncology

## 2020-11-05 DIAGNOSIS — E041 Nontoxic single thyroid nodule: Secondary | ICD-10-CM | POA: Diagnosis not present

## 2020-11-05 DIAGNOSIS — E042 Nontoxic multinodular goiter: Secondary | ICD-10-CM | POA: Diagnosis not present

## 2020-11-07 ENCOUNTER — Telehealth: Payer: Self-pay

## 2020-11-07 NOTE — Telephone Encounter (Signed)
Called and spoke with patient. Told her that, per Dr Chryl Heck, thyroid imaging showed a few nodules but nothing that warranted a biopsy. Patient verbalized understanding.

## 2020-11-18 LAB — SURGICAL PATHOLOGY

## 2020-11-20 ENCOUNTER — Encounter: Payer: Medicare Other | Admitting: Diagnostic Neuroimaging

## 2020-12-03 ENCOUNTER — Inpatient Hospital Stay: Payer: Medicare Other | Attending: Hematology and Oncology | Admitting: Hematology and Oncology

## 2020-12-03 NOTE — Progress Notes (Deleted)
Lori Sanchez CONSULT NOTE  Patient Care Team: [name], MD as PCP - General (Internal Medicine)  CHIEF COMPLAINTS/PURPOSE OF CONSULTATION:  Severe anemia follow up  ASSESSMENT & PLAN:   [name] who was referred to hematology for evaluation of possible iron deficiency anemia. During her initial visit, she was noted to have normocytic normochromic anemia, reported black stools, weight loss along with severe anemia, she had endoscopy and colonoscopy which according to the patient were unremarkable. She does not have any evidence of myeloma protein on serum protein electrophoresis, creatinine is normal, no concerns for active myeloma. No evidence of nutritional deficiency or hemolysis. I have recommended that we proceed with systemic imaging given the unexplained weight loss, severe anemia as well as also proceed with bone marrow aspiration and biopsy She is here for FU. Systemic imaging showed thyroid nodules and a recommendation was to do consider thyroid biopsy. BMB showed no clear evidence of myelodysplasia, cytogenetics normal. Hypercellular marrow for her age. I do believe she may have early myelodysplasia, she feels better today No immediate indication for transfusion. I recommended to proceed with US thyroid and RTC in 4 weeks. She was encouraged to call me with any questions or concerns.   HISTORY OF PRESENTING ILLNESS:   Lori Sanchez 80 y.o. female is here because of anemia.  This is a very pleasant 80 year old African-American female patient with past medical history significant for temporal arteritis, but not biopsy-proven on chronic prednisone), osteoporosis on alendronate, hypertension, dyslipidemia referred to hematology for evaluation of so-called iron deficiency anemia.   Interval history  Ms Wolsky is here for a follow up.   MEDICAL HISTORY:  Past Medical History:  Diagnosis Date   Arthritis    GERD  (gastroesophageal reflux disease)    Hyperlipemia    Hypertension    Osteoporosis    Shingles    Temporal arteritis ([place])     SURGICAL HISTORY: Past Surgical History:  Procedure Laterality Date   ABDOMINAL HYSTERECTOMY  1978   BREAST EXCISIONAL BIOPSY Right 2009   benign   CATARACT EXTRACTION W/ INTRAOCULAR LENS IMPLANT Bilateral 07/2020    SOCIAL HISTORY: Social History   Socioeconomic History   Marital status: Widowed    Spouse name: Not on file   Number of children: 4   Years of education: Not on file   Highest education level: GED or equivalent  Occupational History    Comment: retired  Tobacco Use   Smoking status: Former   Smokeless tobacco: Never   Tobacco comments:    quit 1985  Vaping Use   Vaping Use: Never used  Substance and Sexual Activity   Alcohol use: No    Comment: quit 1985   Drug use: No   Sexual activity: Yes  Other Topics Concern   Not on file  Social History Narrative   Lives with dgtr, 2 grandchildren   Caffeine- tea 1-2 day   Social Determinants of Health   Financial Resource Strain: Not on file  Food Insecurity: Not on file  Transportation Needs: Not on file  Physical Activity: Not on file  Stress: Not on file  Social Connections: Not on file  Intimate Partner Violence: Not on file    FAMILY HISTORY: Family History  Problem Relation Age of Onset   Breast cancer Sister 56   Breast cancer Maternal Aunt    Kidney failure Mother    Heart failure Father    Cancer Brother  ALLERGIES:  has No Known Allergies.  MEDICATIONS:  Current Outpatient Medications  Medication Sig Dispense Refill   alendronate (FOSAMAX) 70 MG tablet TAKE 1 TABLET BY MOUTH  WEEKLY 1/2 HOUR BEFORE THE  FIRST FOOD, BEVERAGE OR  MEDICINE OF THE DAY WITH  PLAIN WATER     amLODipine (NORVASC) 5 MG tablet Take 2 tablets (10 mg total) by mouth daily. 20 tablet 0   aspirin EC 81 MG tablet Take 81 mg by mouth daily. (Patient not taking: No sig reported)      Iron, Ferrous Sulfate, 325 (65 Fe) MG TABS Take by mouth 2 (two) times daily.     losartan (COZAAR) 100 MG tablet Take 100 mg by mouth daily.     meclizine (ANTIVERT) 25 MG tablet Take 1 tablet (25 mg total) by mouth 3 (three) times daily as needed for dizziness. (Patient not taking: No sig reported) 15 tablet 0   pantoprazole (PROTONIX) 40 MG tablet Take 1 tablet by mouth 2 (two) times daily.     pravastatin (PRAVACHOL) 40 MG tablet Take 40 mg by mouth daily. (Patient not taking: No sig reported)     predniSONE (DELTASONE) 5 MG tablet Take 5 mg by mouth daily.     valACYclovir (VALTREX) 1000 MG tablet Take 1 tablet (1,000 mg total) by mouth 3 (three) times daily. (Patient not taking: No sig reported) 21 tablet 0   No current facility-administered medications for this visit.     PHYSICAL EXAMINATION: ECOG PERFORMANCE STATUS: 2 - Symptomatic, <50% confined to bed  There were no vitals filed for this visit.   There were no vitals filed for this visit.   PE deferred in counseling  LABORATORY DATA:  I have reviewed the data as listed Lab Results  Component Value Date   WBC 6.5 10/29/2020   HGB 7.9 (L) 10/29/2020   HCT 26.9 (L) 10/29/2020   MCV 80.1 10/29/2020   PLT 537 (H) 10/29/2020     Chemistry      Component Value Date/Time   NA 140 09/23/2020 1117   K 3.9 09/23/2020 1117   CL 104 09/23/2020 1117   CO2 24 09/23/2020 1117   BUN 11 09/23/2020 1117   CREATININE 0.87 09/23/2020 1117      Component Value Date/Time   CALCIUM 9.9 09/23/2020 1117   ALKPHOS 94 09/23/2020 1117   AST 13 (L) 09/23/2020 1117   ALT 10 09/23/2020 1117   BILITOT 0.5 09/23/2020 1117     Labs reviewed No nutritional deficiency No hemolysis SPEP normal.   RADIOGRAPHIC STUDIES: I have personally reviewed the radiological images as listed and agreed with the findings in the report.  US THYROID  Result Date: 11/05/2020 CLINICAL DATA:  Thyroid nodules by chest CT EXAM: THYROID ULTRASOUND  TECHNIQUE: Ultrasound examination of the thyroid gland and adjacent soft tissues was performed. COMPARISON:  None. FINDINGS: Parenchymal Echotexture: Moderately heterogenous Isthmus: 7 mm Right lobe: 5.9 x 1.3 x 2.1 cm Left lobe: 4.7 x 1.5 x 2.5 cm _________________________________________________________ Estimated total number of nodules >/= 1 cm: 4 Number of spongiform nodules >/=  2 cm not described below (TR1): 0 Number of mixed cystic and solid nodules >/= 1.5 cm not described below (TR2): 0 _________________________________________________________ Nodule # 1: Location: Isthmus; Mid Maximum size: 1.4 cm; Other 2 dimensions: 0.7 x 1.4 cm Composition: mixed cystic and solid (1) Echogenicity: hypoechoic (2) Shape: not taller-than-wide (0) Margins: ill-defined (0) Echogenic foci: none (0) ACR TI-RADS total points: 3. ACR TI-RADS risk  category: TR3 (3 points). ACR TI-RADS recommendations: Given size (<1.4 cm) and appearance, this nodule does NOT meet TI-RADS criteria for biopsy or dedicated follow-up. _________________________________________________________ Nodule # 2: Location: Right; Mid Maximum size: 1.7 cm; Other 2 dimensions: 1.3 x 1.2 cm Composition: spongiform (0) Echogenicity: isoechoic (1) Shape: not taller-than-wide (0) Margins: ill-defined (0) Echogenic foci: none (0) ACR TI-RADS total points: 1. ACR TI-RADS risk category: TR1 (0-1 points). ACR TI-RADS recommendations: This nodule does NOT meet TI-RADS criteria for biopsy or dedicated follow-up. _________________________________________________________ Nodule # 3: Location: Left; Mid Maximum size: 1.9 cm; Other 2 dimensions: 0.9 x 1.6 cm Composition: mixed cystic and solid (1) Echogenicity: isoechoic (1) Shape: not taller-than-wide (0) Margins: smooth (0) Echogenic foci: none (0) ACR TI-RADS total points: 2. ACR TI-RADS risk category: TR2 (2 points). ACR TI-RADS recommendations: This nodule does NOT meet TI-RADS criteria for biopsy or dedicated  follow-up. _________________________________________________________ Nodule # 4: Location: Left; Inferior Maximum size: 2.3 cm; Other 2 dimensions: 1.6 x 2.1 cm Composition: mixed cystic and solid (1) Echogenicity: isoechoic (1) Shape: not taller-than-wide (0) Margins: smooth (0) Echogenic foci: none (0) ACR TI-RADS total points: 2. ACR TI-RADS risk category: TR2 (2 points). ACR TI-RADS recommendations: This nodule does NOT meet TI-RADS criteria for biopsy or dedicated follow-up. _________________________________________________________ There are additional subcentimeter benign cystic nodules noted which are not fully described by TI rads criteria. Dominant nodules as above. No adenopathy. IMPRESSION: 1.4 cm mid isthmus TR 3 nodule does not meet criteria for biopsy or follow-up. Additional bilateral TR 1 and TR 2 nodules which also do not meet criteria for biopsy or follow-up. The above is in keeping with the ACR TI-RADS recommendations - J Am Coll Radiol 2017;14:587-595. Electronically Signed   By: Jerilynn Mages.  Shick M.D.   On: 11/05/2020 16:05    All questions were answered. The patient knows to call the clinic with any problems, questions or concerns. We reviewed BMB findings and imaging findings. I spent 20 minutes in the care of the patient including History, review of records, counseling and coordination of care.    Benay Pike, MD 12/03/2020 9:43 AM

## 2020-12-04 ENCOUNTER — Telehealth: Payer: Self-pay | Admitting: Hematology and Oncology

## 2020-12-04 NOTE — Telephone Encounter (Signed)
Called pt to r/s appt per 8/17 sch msg. Pt said she is not interested in r/s at this time. She will call back at a later day to r/s.

## 2021-01-06 DIAGNOSIS — R21 Rash and other nonspecific skin eruption: Secondary | ICD-10-CM | POA: Diagnosis not present

## 2021-02-03 DIAGNOSIS — Z23 Encounter for immunization: Secondary | ICD-10-CM | POA: Diagnosis not present

## 2021-02-03 DIAGNOSIS — Z0000 Encounter for general adult medical examination without abnormal findings: Secondary | ICD-10-CM | POA: Diagnosis not present

## 2021-02-03 DIAGNOSIS — Z7952 Long term (current) use of systemic steroids: Secondary | ICD-10-CM | POA: Diagnosis not present

## 2021-02-03 DIAGNOSIS — M316 Other giant cell arteritis: Secondary | ICD-10-CM | POA: Diagnosis not present

## 2021-02-03 DIAGNOSIS — I10 Essential (primary) hypertension: Secondary | ICD-10-CM | POA: Diagnosis not present

## 2021-02-03 DIAGNOSIS — Z1389 Encounter for screening for other disorder: Secondary | ICD-10-CM | POA: Diagnosis not present

## 2021-02-03 DIAGNOSIS — E7800 Pure hypercholesterolemia, unspecified: Secondary | ICD-10-CM | POA: Diagnosis not present

## 2021-02-03 DIAGNOSIS — M810 Age-related osteoporosis without current pathological fracture: Secondary | ICD-10-CM | POA: Diagnosis not present

## 2021-02-03 DIAGNOSIS — D649 Anemia, unspecified: Secondary | ICD-10-CM | POA: Diagnosis not present

## 2021-03-05 DIAGNOSIS — M810 Age-related osteoporosis without current pathological fracture: Secondary | ICD-10-CM | POA: Diagnosis not present

## 2021-03-05 DIAGNOSIS — E7800 Pure hypercholesterolemia, unspecified: Secondary | ICD-10-CM | POA: Diagnosis not present

## 2021-03-05 DIAGNOSIS — D509 Iron deficiency anemia, unspecified: Secondary | ICD-10-CM | POA: Diagnosis not present

## 2021-03-05 DIAGNOSIS — I10 Essential (primary) hypertension: Secondary | ICD-10-CM | POA: Diagnosis not present

## 2021-03-06 DIAGNOSIS — H6123 Impacted cerumen, bilateral: Secondary | ICD-10-CM | POA: Diagnosis not present

## 2021-03-16 DIAGNOSIS — M150 Primary generalized (osteo)arthritis: Secondary | ICD-10-CM | POA: Diagnosis not present

## 2021-03-16 DIAGNOSIS — M549 Dorsalgia, unspecified: Secondary | ICD-10-CM | POA: Diagnosis not present

## 2021-03-16 DIAGNOSIS — D649 Anemia, unspecified: Secondary | ICD-10-CM | POA: Diagnosis not present

## 2021-03-16 DIAGNOSIS — M810 Age-related osteoporosis without current pathological fracture: Secondary | ICD-10-CM | POA: Diagnosis not present

## 2021-03-16 DIAGNOSIS — R5383 Other fatigue: Secondary | ICD-10-CM | POA: Diagnosis not present

## 2021-03-16 DIAGNOSIS — M316 Other giant cell arteritis: Secondary | ICD-10-CM | POA: Diagnosis not present

## 2021-05-12 DIAGNOSIS — I10 Essential (primary) hypertension: Secondary | ICD-10-CM | POA: Diagnosis not present

## 2021-05-12 DIAGNOSIS — D649 Anemia, unspecified: Secondary | ICD-10-CM | POA: Diagnosis not present

## 2021-05-12 DIAGNOSIS — R42 Dizziness and giddiness: Secondary | ICD-10-CM | POA: Diagnosis not present

## 2021-06-10 DIAGNOSIS — D649 Anemia, unspecified: Secondary | ICD-10-CM | POA: Diagnosis not present

## 2021-06-22 ENCOUNTER — Telehealth: Payer: Self-pay | Admitting: Hematology and Oncology

## 2021-06-22 NOTE — Telephone Encounter (Signed)
Scheduled appt per 3/6 referral. Pt was already established with Dr. Chryl Heck. Pt is aware of appt date and time. Pt is aware to arrive 15 mins prior to appt time and to bring and updated insurance card. Pt is aware of appt location.   ?

## 2021-07-02 DIAGNOSIS — H938X3 Other specified disorders of ear, bilateral: Secondary | ICD-10-CM | POA: Diagnosis not present

## 2021-07-03 ENCOUNTER — Telehealth: Payer: Self-pay | Admitting: Hematology and Oncology

## 2021-07-03 ENCOUNTER — Inpatient Hospital Stay: Payer: Medicare Other | Admitting: Hematology and Oncology

## 2021-07-03 NOTE — Telephone Encounter (Signed)
R/s pt's appt per provider. Pt is aware of new appt date and time.  ?

## 2021-07-14 DIAGNOSIS — M79641 Pain in right hand: Secondary | ICD-10-CM | POA: Diagnosis not present

## 2021-07-14 DIAGNOSIS — M316 Other giant cell arteritis: Secondary | ICD-10-CM | POA: Diagnosis not present

## 2021-07-14 DIAGNOSIS — M1990 Unspecified osteoarthritis, unspecified site: Secondary | ICD-10-CM | POA: Diagnosis not present

## 2021-07-14 DIAGNOSIS — M549 Dorsalgia, unspecified: Secondary | ICD-10-CM | POA: Diagnosis not present

## 2021-07-14 DIAGNOSIS — M810 Age-related osteoporosis without current pathological fracture: Secondary | ICD-10-CM | POA: Diagnosis not present

## 2021-07-14 DIAGNOSIS — R5383 Other fatigue: Secondary | ICD-10-CM | POA: Diagnosis not present

## 2021-07-14 DIAGNOSIS — M79642 Pain in left hand: Secondary | ICD-10-CM | POA: Diagnosis not present

## 2021-07-14 DIAGNOSIS — M150 Primary generalized (osteo)arthritis: Secondary | ICD-10-CM | POA: Diagnosis not present

## 2021-07-15 ENCOUNTER — Encounter: Payer: Self-pay | Admitting: Hematology and Oncology

## 2021-07-15 ENCOUNTER — Other Ambulatory Visit: Payer: Self-pay

## 2021-07-15 ENCOUNTER — Inpatient Hospital Stay: Payer: Medicare Other | Attending: Hematology and Oncology | Admitting: Hematology and Oncology

## 2021-07-15 ENCOUNTER — Inpatient Hospital Stay: Payer: Medicare Other

## 2021-07-15 VITALS — BP 129/72 | HR 75 | Temp 97.9°F | Resp 16 | Ht 62.0 in | Wt 125.5 lb

## 2021-07-15 DIAGNOSIS — D649 Anemia, unspecified: Secondary | ICD-10-CM

## 2021-07-15 DIAGNOSIS — D75839 Thrombocytosis, unspecified: Secondary | ICD-10-CM | POA: Insufficient documentation

## 2021-07-15 DIAGNOSIS — R634 Abnormal weight loss: Secondary | ICD-10-CM | POA: Insufficient documentation

## 2021-07-15 DIAGNOSIS — E042 Nontoxic multinodular goiter: Secondary | ICD-10-CM | POA: Diagnosis not present

## 2021-07-15 DIAGNOSIS — K921 Melena: Secondary | ICD-10-CM | POA: Insufficient documentation

## 2021-07-15 LAB — RETICULOCYTES
Immature Retic Fract: 20 % — ABNORMAL HIGH (ref 2.3–15.9)
RBC.: 3.77 MIL/uL — ABNORMAL LOW (ref 3.87–5.11)
Retic Count, Absolute: 52.4 10*3/uL (ref 19.0–186.0)
Retic Ct Pct: 1.4 % (ref 0.4–3.1)

## 2021-07-15 LAB — CBC WITH DIFFERENTIAL/PLATELET
Abs Immature Granulocytes: 0.03 10*3/uL (ref 0.00–0.07)
Basophils Absolute: 0.1 10*3/uL (ref 0.0–0.1)
Basophils Relative: 1 %
Eosinophils Absolute: 0.2 10*3/uL (ref 0.0–0.5)
Eosinophils Relative: 4 %
HCT: 29.1 % — ABNORMAL LOW (ref 36.0–46.0)
Hemoglobin: 9 g/dL — ABNORMAL LOW (ref 12.0–15.0)
Immature Granulocytes: 1 %
Lymphocytes Relative: 17 %
Lymphs Abs: 0.9 10*3/uL (ref 0.7–4.0)
MCH: 24.1 pg — ABNORMAL LOW (ref 26.0–34.0)
MCHC: 30.9 g/dL (ref 30.0–36.0)
MCV: 77.8 fL — ABNORMAL LOW (ref 80.0–100.0)
Monocytes Absolute: 0.6 10*3/uL (ref 0.1–1.0)
Monocytes Relative: 11 %
Neutro Abs: 3.6 10*3/uL (ref 1.7–7.7)
Neutrophils Relative %: 66 %
Platelets: 505 10*3/uL — ABNORMAL HIGH (ref 150–400)
RBC: 3.74 MIL/uL — ABNORMAL LOW (ref 3.87–5.11)
RDW: 16.6 % — ABNORMAL HIGH (ref 11.5–15.5)
WBC: 5.4 10*3/uL (ref 4.0–10.5)
nRBC: 0 % (ref 0.0–0.2)

## 2021-07-15 LAB — CMP (CANCER CENTER ONLY)
ALT: 9 U/L (ref 0–44)
AST: 13 U/L — ABNORMAL LOW (ref 15–41)
Albumin: 3.8 g/dL (ref 3.5–5.0)
Alkaline Phosphatase: 88 U/L (ref 38–126)
Anion gap: 9 (ref 5–15)
BUN: 17 mg/dL (ref 8–23)
CO2: 25 mmol/L (ref 22–32)
Calcium: 10.2 mg/dL (ref 8.9–10.3)
Chloride: 102 mmol/L (ref 98–111)
Creatinine: 0.89 mg/dL (ref 0.44–1.00)
GFR, Estimated: >60 mL/min (ref >60)
Glucose, Bld: 90 mg/dL (ref 70–99)
Potassium: 4.1 mmol/L (ref 3.5–5.1)
Sodium: 136 mmol/L (ref 135–145)
Total Bilirubin: 0.2 mg/dL — ABNORMAL LOW (ref 0.3–1.2)
Total Protein: 9.2 g/dL — ABNORMAL HIGH (ref 6.5–8.1)

## 2021-07-15 LAB — IRON AND IRON BINDING CAPACITY (CC-WL,HP ONLY)
Iron: 25 ug/dL — ABNORMAL LOW (ref 28–170)
Saturation Ratios: 9 % — ABNORMAL LOW (ref 10.4–31.8)
TIBC: 263 ug/dL (ref 250–450)
UIBC: 238 ug/dL

## 2021-07-15 LAB — VITAMIN B12: Vitamin B-12: 193 pg/mL (ref 180–914)

## 2021-07-15 LAB — LACTATE DEHYDROGENASE: LDH: 128 U/L (ref 98–192)

## 2021-07-15 NOTE — Progress Notes (Signed)
Lori Sanchez ?CONSULT NOTE ? ?Patient Care Team: ?Lori Carol, MD as PCP - General (Internal Medicine) ? ?CHIEF COMPLAINTS/PURPOSE OF CONSULTATION:  ?Severe anemia follow up ? ?ASSESSMENT & PLAN:  ? ?This is a pleasant 81 year old female patient who was referred to hematology for evaluation of possible iron deficiency anemia. ?During her initial visit, she was noted to have normocytic normochromic anemia, reported black stools, weight loss along with severe anemia, she had endoscopy and colonoscopy which according to the patient were unremarkable. ?She does not have any evidence of myeloma protein on serum protein electrophoresis, creatinine is normal, no concerns for active myeloma. ?No evidence of nutritional deficiency or hemolysis. ?I have recommended that we proceed with systemic imaging given the unexplained weight loss, severe anemia as well as also proceed with bone marrow aspiration and biopsy ?Systemic imaging showed thyroid nodules and a recommendation was to do consider thyroid biopsy. ?BMB showed no clear evidence of myelodysplasia, cytogenetics normal. Hypercellular marrow for her age. ?She was last seen in July here, then didn't follow up. ?She is here for a visit since her doctor told her the counts are low apparently. ?We have repeated labs and that this showed a hemoglobin of 9 g/dL, some microcytosis and hypochromia noted along with thrombocytosis.  Once again no evidence of hemolysis today.  Rest of the labs are pending at the time of this visit. ?I have recommended that we proceed with work-up 1 more time and she will return to clinic in 2 weeks for additional recommendations.  At this time with a hemoglobin of 9 g/dL which is improved compared to her last year labs, she does not need any immediate blood transfusion.  She can continue oral iron in the interim as she has already been doing for the past 4 months. ?With regards to her thyroid nodules, last ultrasound thyroid did not  suggest any need for biopsy. ? ?Return to clinic in 2 weeks ? ?HISTORY OF PRESENTING ILLNESS:  ? ?Lori Sanchez 81 y.o. female is here because of anemia. ? ?This is a very pleasant 81 year old African-American female patient with past medical history significant for temporal arteritis, but not biopsy-proven on chronic prednisone), osteoporosis on alendronate, hypertension, dyslipidemia referred to hematology for evaluation of so-called iron deficiency anemia.  ? ?Interval history ? ?Lori Sanchez is here for a follow up. ?She feels fatigued, No black stool until she started taking iron supplementation ?No change in breathing. No change in urinary habits. ?Rest of the pertinent 10 point ROS reviewed and negative. ? ?MEDICAL HISTORY:  ?Past Medical History:  ?Diagnosis Date  ? Arthritis   ? GERD (gastroesophageal reflux disease)   ? Hyperlipemia   ? Hypertension   ? Osteoporosis   ? Shingles   ? Temporal arteritis ([place])   ? ? ?SURGICAL HISTORY: ?Past Surgical History:  ?Procedure Laterality Date  ? ABDOMINAL HYSTERECTOMY  1978  ? BREAST EXCISIONAL BIOPSY Right 2009  ? benign  ? CATARACT EXTRACTION W/ INTRAOCULAR LENS IMPLANT Bilateral 07/2020  ? ? ?SOCIAL HISTORY: ?Social History  ? ?Socioeconomic History  ? Marital status: Widowed  ?  Spouse name: Not on file  ? Number of children: 4  ? Years of education: Not on file  ? Highest education level: GED or equivalent  ?Occupational History  ?  Comment: retired  ?Tobacco Use  ? Smoking status: Former  ? Smokeless tobacco: Never  ? Tobacco comments:  ?  quit 1985  ?Vaping Use  ? Vaping Use: Never used  ?  Substance and Sexual Activity  ? Alcohol use: No  ?  Comment: quit 1985  ? Drug use: No  ? Sexual activity: Yes  ?Other Topics Concern  ? Not on file  ?Social History Narrative  ? Lives with dgtr, 2 grandchildren  ? Caffeine- tea 1-2 day  ? ?Social Determinants of Health  ? ?Financial Resource Strain: Not on file  ?Food Insecurity: Not on file  ?Transportation Needs: Not on  file  ?Physical Activity: Not on file  ?Stress: Not on file  ?Social Connections: Not on file  ?Intimate Partner Violence: Not on file  ? ? ?FAMILY HISTORY: ?Family History  ?Problem Relation Age of Onset  ? Breast cancer Sister 65  ? Breast cancer Maternal Aunt   ? Kidney failure Mother   ? Heart failure Father   ? Cancer Brother   ? ? ?ALLERGIES:  has No Known Allergies. ? ?MEDICATIONS:  ?Current Outpatient Medications  ?Medication Sig Dispense Refill  ? alendronate (FOSAMAX) 70 MG tablet TAKE 1 TABLET BY MOUTH  WEEKLY 1/2 HOUR BEFORE THE  FIRST FOOD, BEVERAGE OR  MEDICINE OF THE DAY WITH  PLAIN WATER    ? amLODipine (NORVASC) 5 MG tablet Take 2 tablets (10 mg total) by mouth daily. 20 tablet 0  ? aspirin EC 81 MG tablet Take 81 mg by mouth daily. (Patient not taking: No sig reported)    ? Iron, Ferrous Sulfate, 325 (65 Fe) MG TABS Take by mouth 2 (two) times daily.    ? losartan (COZAAR) 100 MG tablet Take 100 mg by mouth daily.    ? meclizine (ANTIVERT) 25 MG tablet Take 1 tablet (25 mg total) by mouth 3 (three) times daily as needed for dizziness. (Patient not taking: No sig reported) 15 tablet 0  ? pantoprazole (PROTONIX) 40 MG tablet Take 1 tablet by mouth 2 (two) times daily.    ? pravastatin (PRAVACHOL) 40 MG tablet Take 40 mg by mouth daily. (Patient not taking: No sig reported)    ? predniSONE (DELTASONE) 5 MG tablet Take 5 mg by mouth daily.    ? valACYclovir (VALTREX) 1000 MG tablet Take 1 tablet (1,000 mg total) by mouth 3 (three) times daily. (Patient not taking: No sig reported) 21 tablet 0  ? ?No current facility-administered medications for this visit.  ? ? ? ?PHYSICAL EXAMINATION: ?ECOG PERFORMANCE STATUS: 2 - Symptomatic, <50% confined to bed ? ?Vitals:  ? 07/15/21 1438  ?BP: 129/72  ?Pulse: 75  ?Resp: 16  ?Temp: 97.9 ?F (36.6 ?C)  ?SpO2: 100%  ? ? ?Filed Weights  ? 07/15/21 1438  ?Weight: 125 lb 8 oz (56.9 kg)  ? ?Physical Exam ?Constitutional:   ?   Appearance: Normal appearance.   ?Cardiovascular:  ?   Rate and Rhythm: Normal rate and regular rhythm.  ?   Pulses: Normal pulses.  ?   Heart sounds: Normal heart sounds.  ?Pulmonary:  ?   Effort: Pulmonary effort is normal.  ?   Breath sounds: Normal breath sounds.  ?Abdominal:  ?   General: Abdomen is flat.  ?   Palpations: Abdomen is soft.  ?Musculoskeletal:     ?   General: No swelling or tenderness. Normal range of motion.  ?   Cervical back: Normal range of motion and neck supple. No rigidity.  ?Lymphadenopathy:  ?   Cervical: No cervical adenopathy.  ?Skin: ?   General: Skin is warm and dry.  ?Neurological:  ?   General: No focal deficit present.  ?  Mental Status: She is alert.  ?Psychiatric:     ?   Mood and Affect: Mood normal.  ? ? ? ?LABORATORY DATA:  ?I have reviewed the data as listed ?Lab Results  ?Component Value Date  ? WBC 6.5 10/29/2020  ? HGB 7.9 (L) 10/29/2020  ? HCT 26.9 (L) 10/29/2020  ? MCV 80.1 10/29/2020  ? PLT 537 (H) 10/29/2020  ? ?  Chemistry   ?   ?Component Value Date/Time  ? NA 140 09/23/2020 1117  ? K 3.9 09/23/2020 1117  ? CL 104 09/23/2020 1117  ? CO2 24 09/23/2020 1117  ? BUN 11 09/23/2020 1117  ? CREATININE 0.87 09/23/2020 1117  ?    ?Component Value Date/Time  ? CALCIUM 9.9 09/23/2020 1117  ? ALKPHOS 94 09/23/2020 1117  ? AST 13 (L) 09/23/2020 1117  ? ALT 10 09/23/2020 1117  ? BILITOT 0.5 09/23/2020 1117  ?  ? ?Labs reviewed ?No nutritional deficiency ?No hemolysis ?SPEP normal. ? ? ?RADIOGRAPHIC STUDIES: ?I have personally reviewed the radiological images as listed and agreed with the findings in the report. ? ?No results found. ? ?All questions were answered. The patient knows to call the clinic with any problems, questions or concerns. ?I spent 30 minutes in the care of the patient including History, physical examination, review of records, counseling and coordination of care. ?  ? Benay Pike, MD ?07/15/2021 2:42 PM ? ? ? ?

## 2021-07-16 LAB — FOLATE RBC
Folate, Hemolysate: 347 ng/mL
Folate, RBC: 1192 ng/mL (ref >498)
Hematocrit: 29.1 % — ABNORMAL LOW (ref 34.0–46.6)

## 2021-07-16 LAB — FERRITIN: Ferritin: 293 ng/mL (ref 11–307)

## 2021-07-17 DIAGNOSIS — E7800 Pure hypercholesterolemia, unspecified: Secondary | ICD-10-CM | POA: Diagnosis not present

## 2021-07-17 DIAGNOSIS — I10 Essential (primary) hypertension: Secondary | ICD-10-CM | POA: Diagnosis not present

## 2021-07-17 DIAGNOSIS — D509 Iron deficiency anemia, unspecified: Secondary | ICD-10-CM | POA: Diagnosis not present

## 2021-07-17 LAB — PROTEIN ELECTROPHORESIS, SERUM, WITH REFLEX
A/G Ratio: 0.7 (ref 0.7–1.7)
Albumin ELP: 3.3 g/dL (ref 2.9–4.4)
Alpha-1-Globulin: 0.4 g/dL (ref 0.0–0.4)
Alpha-2-Globulin: 0.9 g/dL (ref 0.4–1.0)
Beta Globulin: 1.1 g/dL (ref 0.7–1.3)
Gamma Globulin: 2.1 g/dL — ABNORMAL HIGH (ref 0.4–1.8)
Globulin, Total: 4.6 g/dL — ABNORMAL HIGH (ref 2.2–3.9)
Total Protein ELP: 7.9 g/dL (ref 6.0–8.5)

## 2021-07-29 DIAGNOSIS — H04123 Dry eye syndrome of bilateral lacrimal glands: Secondary | ICD-10-CM | POA: Diagnosis not present

## 2021-07-29 DIAGNOSIS — H40013 Open angle with borderline findings, low risk, bilateral: Secondary | ICD-10-CM | POA: Diagnosis not present

## 2021-07-29 DIAGNOSIS — H26493 Other secondary cataract, bilateral: Secondary | ICD-10-CM | POA: Diagnosis not present

## 2021-07-29 DIAGNOSIS — H524 Presbyopia: Secondary | ICD-10-CM | POA: Diagnosis not present

## 2021-07-29 DIAGNOSIS — Z961 Presence of intraocular lens: Secondary | ICD-10-CM | POA: Diagnosis not present

## 2021-08-03 DIAGNOSIS — E7800 Pure hypercholesterolemia, unspecified: Secondary | ICD-10-CM | POA: Diagnosis not present

## 2021-08-03 DIAGNOSIS — I10 Essential (primary) hypertension: Secondary | ICD-10-CM | POA: Diagnosis not present

## 2021-08-03 DIAGNOSIS — M810 Age-related osteoporosis without current pathological fracture: Secondary | ICD-10-CM | POA: Diagnosis not present

## 2021-08-03 DIAGNOSIS — M316 Other giant cell arteritis: Secondary | ICD-10-CM | POA: Diagnosis not present

## 2021-08-03 DIAGNOSIS — R7303 Prediabetes: Secondary | ICD-10-CM | POA: Diagnosis not present

## 2021-08-03 DIAGNOSIS — D649 Anemia, unspecified: Secondary | ICD-10-CM | POA: Diagnosis not present

## 2021-08-04 DIAGNOSIS — M316 Other giant cell arteritis: Secondary | ICD-10-CM | POA: Diagnosis not present

## 2021-08-04 DIAGNOSIS — M549 Dorsalgia, unspecified: Secondary | ICD-10-CM | POA: Diagnosis not present

## 2021-08-04 DIAGNOSIS — R5383 Other fatigue: Secondary | ICD-10-CM | POA: Diagnosis not present

## 2021-08-04 DIAGNOSIS — M79643 Pain in unspecified hand: Secondary | ICD-10-CM | POA: Diagnosis not present

## 2021-08-04 DIAGNOSIS — M810 Age-related osteoporosis without current pathological fracture: Secondary | ICD-10-CM | POA: Diagnosis not present

## 2021-08-07 ENCOUNTER — Inpatient Hospital Stay: Payer: Medicare Other | Attending: Hematology and Oncology | Admitting: Hematology and Oncology

## 2021-10-27 DIAGNOSIS — M25529 Pain in unspecified elbow: Secondary | ICD-10-CM | POA: Diagnosis not present

## 2021-10-27 DIAGNOSIS — M059 Rheumatoid arthritis with rheumatoid factor, unspecified: Secondary | ICD-10-CM | POA: Diagnosis not present

## 2021-10-27 DIAGNOSIS — M25522 Pain in left elbow: Secondary | ICD-10-CM | POA: Diagnosis not present

## 2021-10-27 DIAGNOSIS — M549 Dorsalgia, unspecified: Secondary | ICD-10-CM | POA: Diagnosis not present

## 2021-10-27 DIAGNOSIS — M810 Age-related osteoporosis without current pathological fracture: Secondary | ICD-10-CM | POA: Diagnosis not present

## 2021-10-27 DIAGNOSIS — M542 Cervicalgia: Secondary | ICD-10-CM | POA: Diagnosis not present

## 2021-10-27 DIAGNOSIS — D649 Anemia, unspecified: Secondary | ICD-10-CM | POA: Diagnosis not present

## 2021-10-27 DIAGNOSIS — R5383 Other fatigue: Secondary | ICD-10-CM | POA: Diagnosis not present

## 2021-10-27 DIAGNOSIS — M150 Primary generalized (osteo)arthritis: Secondary | ICD-10-CM | POA: Diagnosis not present

## 2021-10-27 DIAGNOSIS — M79643 Pain in unspecified hand: Secondary | ICD-10-CM | POA: Diagnosis not present

## 2021-10-27 DIAGNOSIS — M316 Other giant cell arteritis: Secondary | ICD-10-CM | POA: Diagnosis not present

## 2021-10-27 DIAGNOSIS — M25521 Pain in right elbow: Secondary | ICD-10-CM | POA: Diagnosis not present

## 2021-11-10 DIAGNOSIS — G5603 Carpal tunnel syndrome, bilateral upper limbs: Secondary | ICD-10-CM | POA: Diagnosis not present

## 2021-12-04 ENCOUNTER — Other Ambulatory Visit: Payer: Self-pay | Admitting: Internal Medicine

## 2021-12-04 ENCOUNTER — Other Ambulatory Visit: Payer: Self-pay | Admitting: Hematology and Oncology

## 2021-12-04 DIAGNOSIS — Z1231 Encounter for screening mammogram for malignant neoplasm of breast: Secondary | ICD-10-CM

## 2021-12-07 DIAGNOSIS — D649 Anemia, unspecified: Secondary | ICD-10-CM | POA: Diagnosis not present

## 2021-12-07 DIAGNOSIS — R42 Dizziness and giddiness: Secondary | ICD-10-CM | POA: Diagnosis not present

## 2021-12-09 ENCOUNTER — Telehealth: Payer: Self-pay | Admitting: Hematology and Oncology

## 2021-12-09 NOTE — Telephone Encounter (Signed)
Pt was referred back over by Dr. Lina Sar office. Called pt to sch f/u with Dr. Chryl Heck as pt is already established. No answer. Left msg for pt to call back to sch f/u.

## 2021-12-09 NOTE — Telephone Encounter (Signed)
Pt was re-referred to [place] by Dr. Lina Sar office. R/s pt's missed appt with Dr. Chryl Heck. Pt is aware of new appt date/time.

## 2021-12-15 ENCOUNTER — Inpatient Hospital Stay: Payer: Medicare Other

## 2021-12-15 ENCOUNTER — Other Ambulatory Visit: Payer: Self-pay

## 2021-12-15 ENCOUNTER — Encounter: Payer: Self-pay | Admitting: Hematology and Oncology

## 2021-12-15 ENCOUNTER — Inpatient Hospital Stay: Payer: Medicare Other | Attending: Hematology and Oncology | Admitting: Hematology and Oncology

## 2021-12-15 VITALS — BP 151/74 | HR 85 | Temp 97.9°F | Resp 16 | Ht 62.0 in | Wt 120.3 lb

## 2021-12-15 DIAGNOSIS — R5383 Other fatigue: Secondary | ICD-10-CM | POA: Insufficient documentation

## 2021-12-15 DIAGNOSIS — Z79899 Other long term (current) drug therapy: Secondary | ICD-10-CM | POA: Insufficient documentation

## 2021-12-15 DIAGNOSIS — D649 Anemia, unspecified: Secondary | ICD-10-CM

## 2021-12-15 LAB — IRON AND IRON BINDING CAPACITY (CC-WL,HP ONLY)
Iron: 16 ug/dL — ABNORMAL LOW (ref 28–170)
Saturation Ratios: 7 % — ABNORMAL LOW (ref 10.4–31.8)
TIBC: 242 ug/dL — ABNORMAL LOW (ref 250–450)
UIBC: 226 ug/dL (ref 148–442)

## 2021-12-15 LAB — COMPREHENSIVE METABOLIC PANEL
ALT: 5 U/L (ref 0–44)
AST: 10 U/L — ABNORMAL LOW (ref 15–41)
Albumin: 3.9 g/dL (ref 3.5–5.0)
Alkaline Phosphatase: 77 U/L (ref 38–126)
Anion gap: 7 (ref 5–15)
BUN: 15 mg/dL (ref 8–23)
CO2: 27 mmol/L (ref 22–32)
Calcium: 10.1 mg/dL (ref 8.9–10.3)
Chloride: 105 mmol/L (ref 98–111)
Creatinine, Ser: 0.83 mg/dL (ref 0.44–1.00)
GFR, Estimated: >60 mL/min (ref >60)
Glucose, Bld: 97 mg/dL (ref 70–99)
Potassium: 3.8 mmol/L (ref 3.5–5.1)
Sodium: 139 mmol/L (ref 135–145)
Total Bilirubin: 0.2 mg/dL — ABNORMAL LOW (ref 0.3–1.2)
Total Protein: 8.2 g/dL — ABNORMAL HIGH (ref 6.5–8.1)

## 2021-12-15 LAB — CBC WITH DIFFERENTIAL/PLATELET
Abs Immature Granulocytes: 0.02 10*3/uL (ref 0.00–0.07)
Basophils Absolute: 0 10*3/uL (ref 0.0–0.1)
Basophils Relative: 1 %
Eosinophils Absolute: 0.2 10*3/uL (ref 0.0–0.5)
Eosinophils Relative: 4 %
HCT: 28.6 % — ABNORMAL LOW (ref 36.0–46.0)
Hemoglobin: 8.9 g/dL — ABNORMAL LOW (ref 12.0–15.0)
Immature Granulocytes: 0 %
Lymphocytes Relative: 16 %
Lymphs Abs: 0.8 10*3/uL (ref 0.7–4.0)
MCH: 24.7 pg — ABNORMAL LOW (ref 26.0–34.0)
MCHC: 31.1 g/dL (ref 30.0–36.0)
MCV: 79.4 fL — ABNORMAL LOW (ref 80.0–100.0)
Monocytes Absolute: 0.6 10*3/uL (ref 0.1–1.0)
Monocytes Relative: 12 %
Neutro Abs: 3.4 10*3/uL (ref 1.7–7.7)
Neutrophils Relative %: 67 %
Platelets: 414 10*3/uL — ABNORMAL HIGH (ref 150–400)
RBC: 3.6 MIL/uL — ABNORMAL LOW (ref 3.87–5.11)
RDW: 15.8 % — ABNORMAL HIGH (ref 11.5–15.5)
WBC: 5 10*3/uL (ref 4.0–10.5)
nRBC: 0 % (ref 0.0–0.2)

## 2021-12-15 LAB — SAMPLE TO BLOOD BANK

## 2021-12-15 NOTE — Progress Notes (Signed)
[name] CONSULT NOTE  Patient Care Team: [name], MD as PCP - General (Internal Medicine)  CHIEF COMPLAINTS/PURPOSE OF CONSULTATION:  Severe anemia follow up  ASSESSMENT & PLAN:   This is a pleasant 81 year old female patient who was referred to hematology for evaluation of possible iron deficiency anemia. During her initial visit, she was noted to have normocytic normochromic anemia, reported black stools, weight loss along with severe anemia, she had endoscopy and colonoscopy which according to the patient were unremarkable. She does not have any evidence of myeloma protein on serum protein electrophoresis, creatinine is normal, no concerns for active myeloma. No evidence of nutritional deficiency or hemolysis. I have recommended that we proceed with systemic imaging given the unexplained weight loss, severe anemia as well as also proceed with bone marrow aspiration and biopsy Systemic imaging showed thyroid nodules and a recommendation was to do consider thyroid biopsy. BMB showed no clear evidence of myelodysplasia, cytogenetics normal. Hypercellular marrow for her age. She was last seen in July here, then didn't follow up. With regards to her thyroid nodules, last ultrasound thyroid did not suggest any need for biopsy. She is once again here for follow-up because she had some labs with her PCP who suggested to go back to hematology.  She cannot quite tell us the numbers.  She however feels very fatigued and dizzy.  She denies any blood loss per stool.  She has been taking oral iron once a day.  Physical examination today, no change.  We have discussed about obtaining reports from PCP versus repeating labs and she opted to repeat CBC here.  I ordered CBC, CMP, iron panel and ferritin.  We have once again discussed that she can certainly go to the emergency room if she continues to feel unwell and requires emergent care.  She expressed understanding.  We will call her  tomorrow with the lab results.  I have also sent a sample to blood bank just in case she requires blood transfusion.  We have done extensive investigation in the past.  We can consider repeat bone marrow biopsy if she continues to have severe worsening anemia.    HISTORY OF PRESENTING ILLNESS:   Lori Sanchez 81 y.o. female is here because of anemia.  This is a very pleasant 81 year old African-American female patient with past medical history significant for temporal arteritis, but not biopsy-proven on chronic prednisone), osteoporosis on alendronate, hypertension, dyslipidemia referred to hematology for evaluation of so-called iron deficiency anemia.   Interval history  [name] is here for a follow up. She says she had recent  blood work at Dr [employer] office and was asked to return to hematology. She complains of fatigue and worsening dizziness in the past 2 weeks. No hematochezia or melena, She denies any weight loss, appetite is ok, eating well. No recent hospitalization.  MEDICAL HISTORY:  Past Medical History:  Diagnosis Date   Arthritis    GERD (gastroesophageal reflux disease)    Hyperlipemia    Hypertension    Osteoporosis    Shingles    Temporal arteritis ([place])     SURGICAL HISTORY: Past Surgical History:  Procedure Laterality Date   ABDOMINAL HYSTERECTOMY  1978   BREAST EXCISIONAL BIOPSY Right 2009   benign   CATARACT EXTRACTION W/ INTRAOCULAR LENS IMPLANT Bilateral 07/2020    SOCIAL HISTORY: Social History   Socioeconomic History   Marital status: Widowed    Spouse name: Not on file   Number of children: 4  Years of education: Not on file   Highest education level: GED or equivalent  Occupational History    Comment: retired  Tobacco Use   Smoking status: Former   Smokeless tobacco: Never   Tobacco comments:    quit 1985  Vaping Use   Vaping Use: Never used  Substance and Sexual Activity   Alcohol use: No    Comment: quit 1985   Drug use:  No   Sexual activity: Yes  Other Topics Concern   Not on file  Social History Narrative   Lives with dgtr, 2 grandchildren   Caffeine- tea 1-2 day   Social Determinants of Health   Financial Resource Strain: Not on file  Food Insecurity: Not on file  Transportation Needs: Not on file  Physical Activity: Not on file  Stress: Not on file  Social Connections: Not on file  Intimate Partner Violence: Not on file    FAMILY HISTORY: Family History  Problem Relation Age of Onset   Breast cancer Sister 73   Breast cancer Maternal Aunt    Kidney failure Mother    Heart failure Father    Cancer Brother     ALLERGIES:  has No Known Allergies.  MEDICATIONS:  Current Outpatient Medications  Medication Sig Dispense Refill   alendronate (FOSAMAX) 70 MG tablet TAKE 1 TABLET BY MOUTH  WEEKLY 1/2 HOUR BEFORE THE  FIRST FOOD, BEVERAGE OR  MEDICINE OF THE DAY WITH  PLAIN WATER     amLODipine (NORVASC) 5 MG tablet Take 2 tablets (10 mg total) by mouth daily. 20 tablet 0   Iron, Ferrous Sulfate, 325 (65 Fe) MG TABS Take by mouth 2 (two) times daily.     losartan (COZAAR) 100 MG tablet Take 100 mg by mouth daily.     pantoprazole (PROTONIX) 40 MG tablet Take 1 tablet by mouth 2 (two) times daily.     No current facility-administered medications for this visit.     PHYSICAL EXAMINATION: ECOG PERFORMANCE STATUS: 2 - Symptomatic, <50% confined to bed  Vitals:   12/15/21 1405  BP: (!) 151/74  Pulse: 85  Resp: 16  Temp: 97.9 F (36.6 C)  SpO2: 100%    Filed Weights   12/15/21 1405  Weight: 120 lb 4.8 oz (54.6 kg)   Physical Exam Constitutional:      Appearance: Normal appearance.  Cardiovascular:     Rate and Rhythm: Normal rate and regular rhythm.     Pulses: Normal pulses.     Heart sounds: Normal heart sounds.  Pulmonary:     Effort: Pulmonary effort is normal.     Breath sounds: Normal breath sounds.  Abdominal:     General: Abdomen is flat.     Palpations: Abdomen is  soft.  Musculoskeletal:        General: No swelling or tenderness. Normal range of motion.     Cervical back: Normal range of motion and neck supple. No rigidity.  Lymphadenopathy:     Cervical: No cervical adenopathy.  Skin:    General: Skin is warm and dry.  Neurological:     General: No focal deficit present.     Mental Status: She is alert.  Psychiatric:        Mood and Affect: Mood normal.      LABORATORY DATA:  I have reviewed the data as listed Lab Results  Component Value Date   WBC 5.4 07/15/2021   HGB 9.0 (L) 07/15/2021   HCT 29.1 (L) 07/15/2021  MCV 77.8 (L) 07/15/2021   PLT 505 (H) 07/15/2021     Chemistry      Component Value Date/Time   NA 136 07/15/2021 1540   K 4.1 07/15/2021 1540   CL 102 07/15/2021 1540   CO2 25 07/15/2021 1540   BUN 17 07/15/2021 1540   CREATININE 0.89 07/15/2021 1540      Component Value Date/Time   CALCIUM 10.2 07/15/2021 1540   ALKPHOS 88 07/15/2021 1540   AST 13 (L) 07/15/2021 1540   ALT 9 07/15/2021 1540   BILITOT 0.2 (L) 07/15/2021 1540     No recent labs to review   RADIOGRAPHIC STUDIES: I have personally reviewed the radiological images as listed and agreed with the findings in the report.  No results found.  All questions were answered. The patient knows to call the clinic with any problems, questions or concerns. I spent 30 minutes in the care of the patient including History, physical examination, review of records, counseling and coordination of care.    Benay Pike, MD 12/15/2021 2:09 PM

## 2021-12-16 ENCOUNTER — Telehealth: Payer: Self-pay | Admitting: *Deleted

## 2021-12-16 LAB — FERRITIN: Ferritin: 192 ng/mL (ref 11–307)

## 2021-12-16 NOTE — Telephone Encounter (Addendum)
-----   Message from Benay Pike, MD sent at 12/16/2021  8:35 AM EDT ----- She continues to be anemic but no iron deficiency. Overall anemia is stable. No indication for transfusion or Iron replacement. Can we have her see Mendel Ryder in a month to repeat CBC and follow.  Thanks.  Pt notified of above- scheduling request sent.

## 2021-12-25 ENCOUNTER — Telehealth: Payer: Self-pay | Admitting: Hematology and Oncology

## 2021-12-25 ENCOUNTER — Ambulatory Visit: Payer: Medicare Other

## 2021-12-25 NOTE — Telephone Encounter (Signed)
Contacted patient to scheduled appointments. Left message with appointment details and a call back number if patient had any questions or could not accommodate the time we provided.   

## 2021-12-29 ENCOUNTER — Ambulatory Visit: Payer: Medicare Other

## 2021-12-30 DIAGNOSIS — L308 Other specified dermatitis: Secondary | ICD-10-CM | POA: Diagnosis not present

## 2021-12-30 DIAGNOSIS — L820 Inflamed seborrheic keratosis: Secondary | ICD-10-CM | POA: Diagnosis not present

## 2022-01-12 ENCOUNTER — Inpatient Hospital Stay: Payer: Medicare Other | Admitting: Adult Health

## 2022-01-12 ENCOUNTER — Inpatient Hospital Stay: Payer: Medicare Other

## 2022-01-18 DIAGNOSIS — H6123 Impacted cerumen, bilateral: Secondary | ICD-10-CM | POA: Diagnosis not present

## 2022-01-22 ENCOUNTER — Ambulatory Visit
Admission: RE | Admit: 2022-01-22 | Discharge: 2022-01-22 | Disposition: A | Discharge status: Home / Self Care | Payer: Medicare Other | Source: Ambulatory Visit | Attending: Internal Medicine | Admitting: Internal Medicine

## 2022-01-22 DIAGNOSIS — Z1231 Encounter for screening mammogram for malignant neoplasm of breast: Secondary | ICD-10-CM | POA: Diagnosis not present

## 2022-01-26 ENCOUNTER — Other Ambulatory Visit: Payer: Self-pay | Admitting: Internal Medicine

## 2022-01-26 DIAGNOSIS — R928 Other abnormal and inconclusive findings on diagnostic imaging of breast: Secondary | ICD-10-CM

## 2022-01-27 DIAGNOSIS — M316 Other giant cell arteritis: Secondary | ICD-10-CM | POA: Diagnosis not present

## 2022-01-27 DIAGNOSIS — M542 Cervicalgia: Secondary | ICD-10-CM | POA: Diagnosis not present

## 2022-01-27 DIAGNOSIS — Z23 Encounter for immunization: Secondary | ICD-10-CM | POA: Diagnosis not present

## 2022-01-27 DIAGNOSIS — R5383 Other fatigue: Secondary | ICD-10-CM | POA: Diagnosis not present

## 2022-01-27 DIAGNOSIS — M25529 Pain in unspecified elbow: Secondary | ICD-10-CM | POA: Diagnosis not present

## 2022-01-27 DIAGNOSIS — M810 Age-related osteoporosis without current pathological fracture: Secondary | ICD-10-CM | POA: Diagnosis not present

## 2022-01-27 DIAGNOSIS — M549 Dorsalgia, unspecified: Secondary | ICD-10-CM | POA: Diagnosis not present

## 2022-01-27 DIAGNOSIS — M79643 Pain in unspecified hand: Secondary | ICD-10-CM | POA: Diagnosis not present

## 2022-01-27 DIAGNOSIS — M059 Rheumatoid arthritis with rheumatoid factor, unspecified: Secondary | ICD-10-CM | POA: Diagnosis not present

## 2022-01-27 DIAGNOSIS — M150 Primary generalized (osteo)arthritis: Secondary | ICD-10-CM | POA: Diagnosis not present

## 2022-01-27 DIAGNOSIS — D649 Anemia, unspecified: Secondary | ICD-10-CM | POA: Diagnosis not present

## 2022-01-29 ENCOUNTER — Ambulatory Visit
Admission: RE | Admit: 2022-01-29 | Discharge: 2022-01-29 | Disposition: A | Discharge status: Home / Self Care | Payer: Medicare Other | Source: Ambulatory Visit | Attending: Internal Medicine | Admitting: Internal Medicine

## 2022-01-29 DIAGNOSIS — R928 Other abnormal and inconclusive findings on diagnostic imaging of breast: Secondary | ICD-10-CM | POA: Diagnosis not present

## 2022-01-29 DIAGNOSIS — R921 Mammographic calcification found on diagnostic imaging of breast: Secondary | ICD-10-CM | POA: Diagnosis not present

## 2022-02-01 DIAGNOSIS — D649 Anemia, unspecified: Secondary | ICD-10-CM | POA: Diagnosis not present

## 2022-02-01 DIAGNOSIS — M316 Other giant cell arteritis: Secondary | ICD-10-CM | POA: Diagnosis not present

## 2022-02-01 DIAGNOSIS — M810 Age-related osteoporosis without current pathological fracture: Secondary | ICD-10-CM | POA: Diagnosis not present

## 2022-02-01 DIAGNOSIS — E7800 Pure hypercholesterolemia, unspecified: Secondary | ICD-10-CM | POA: Diagnosis not present

## 2022-02-01 DIAGNOSIS — I10 Essential (primary) hypertension: Secondary | ICD-10-CM | POA: Diagnosis not present

## 2022-03-28 IMAGING — US US AXILLARY LEFT
1 series · 8 of 8 positions shown · non-contrast
Comparison: Previous exam(s).

CLINICAL DATA: LEFT axillary fullness, possible adenopathy.

EXAM:
DIGITAL DIAGNOSTIC BILATERAL MAMMOGRAM WITH TOMOSYNTHESIS AND CAD;
US AXILLARY LEFT
TECHNIQUE: Bilateral digital diagnostic mammography and breast tomosynthesis
was performed. The images were evaluated with computer-aided
detection.; Targeted ultrasound examination of the left axilla was
performed.

[Series 1: us axillary left · 0.08mm/px · 8 of 8 slices shown]
[im 1/8]
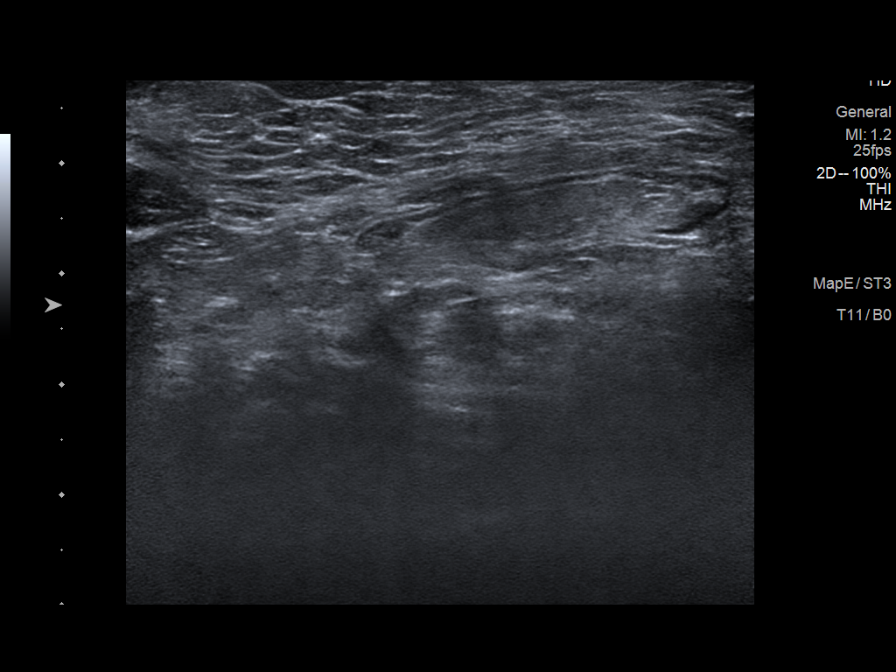
[im 2/8]
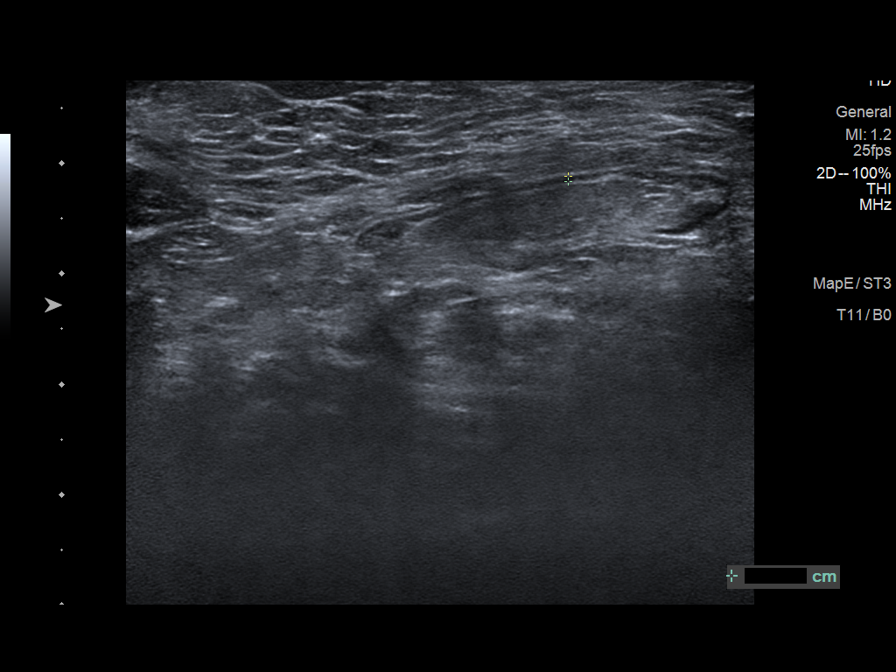
[im 3/8]
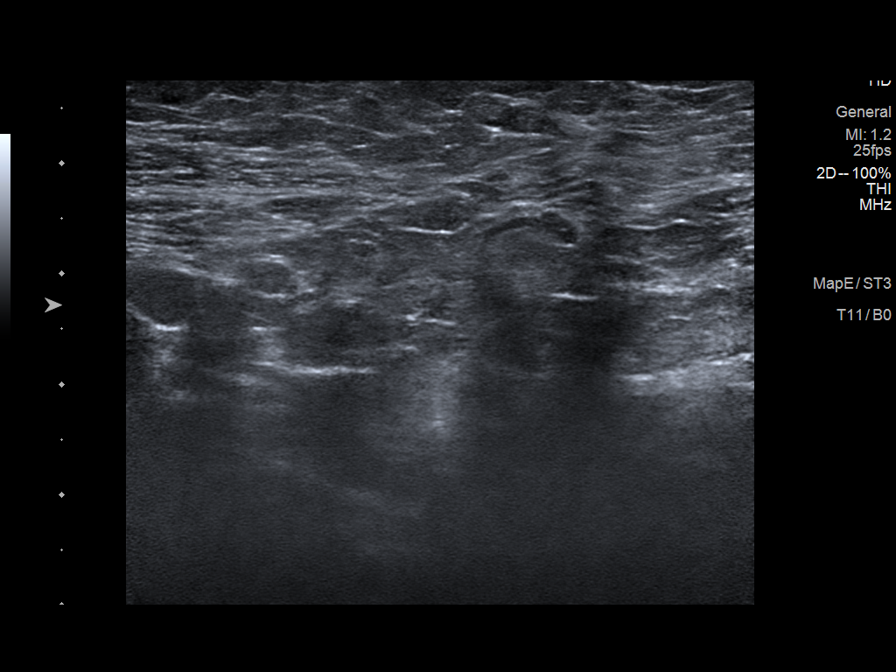
[im 4/8]
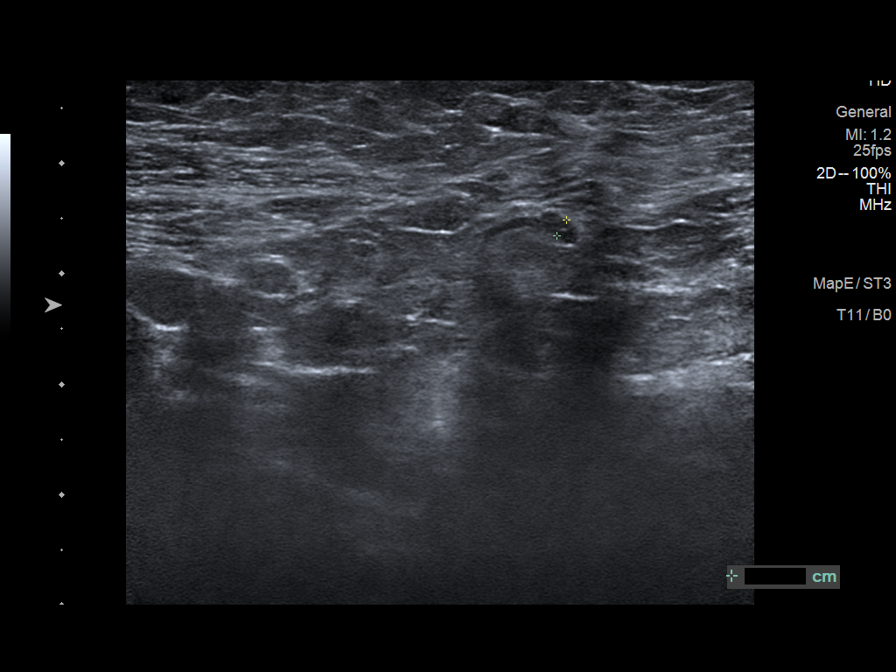
[im 5/8]
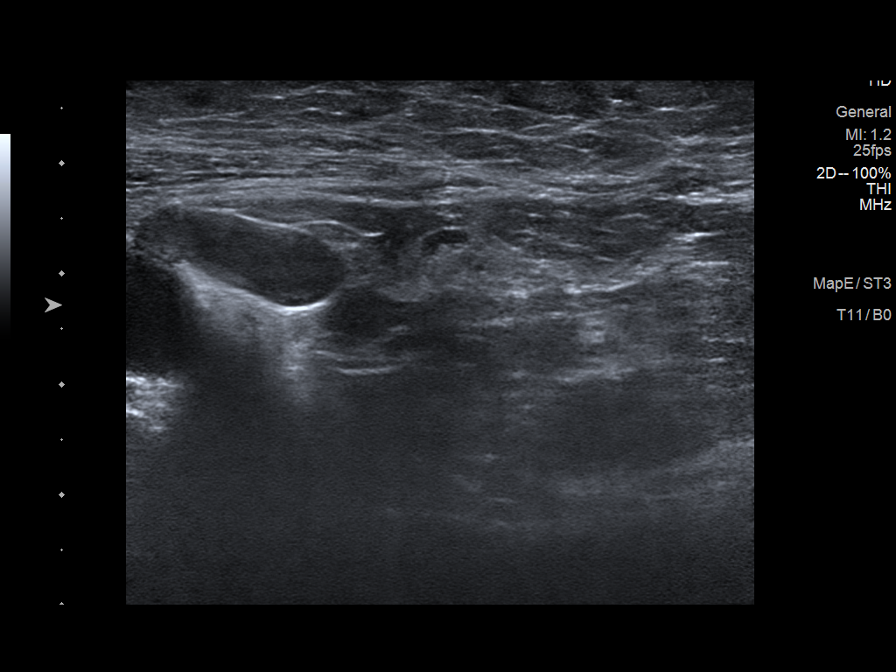
[im 6/8]
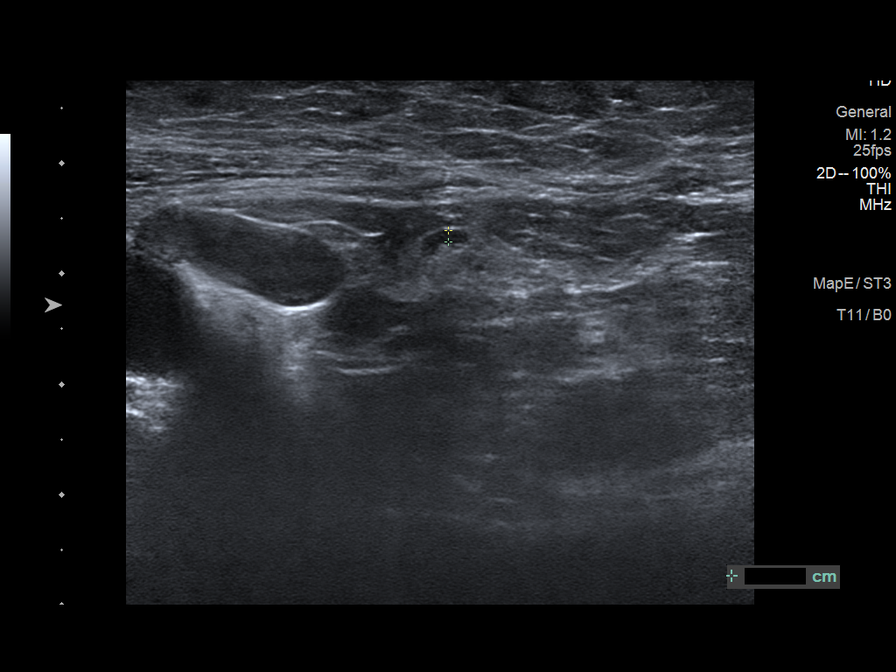
[im 7/8]
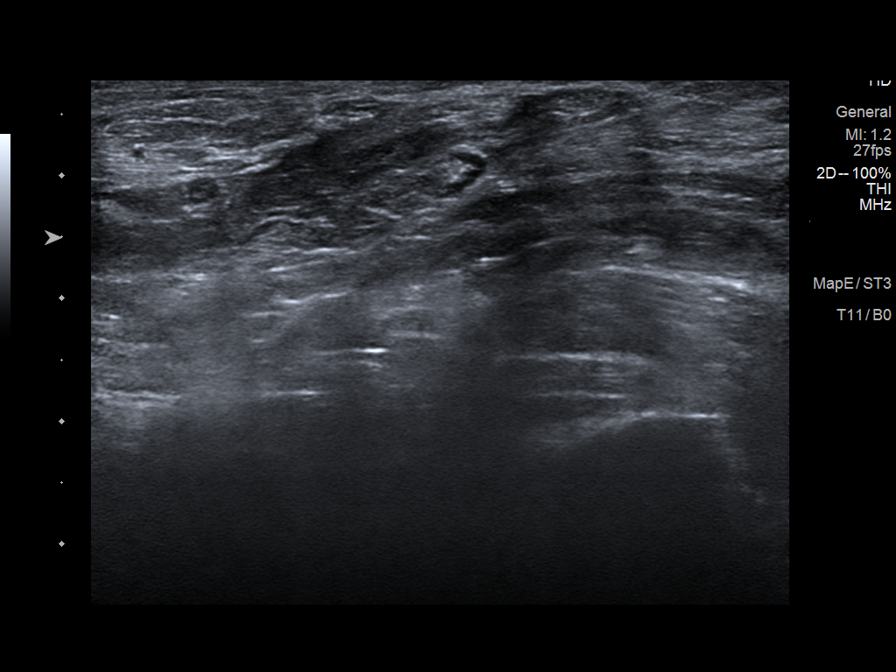
[im 8/8]
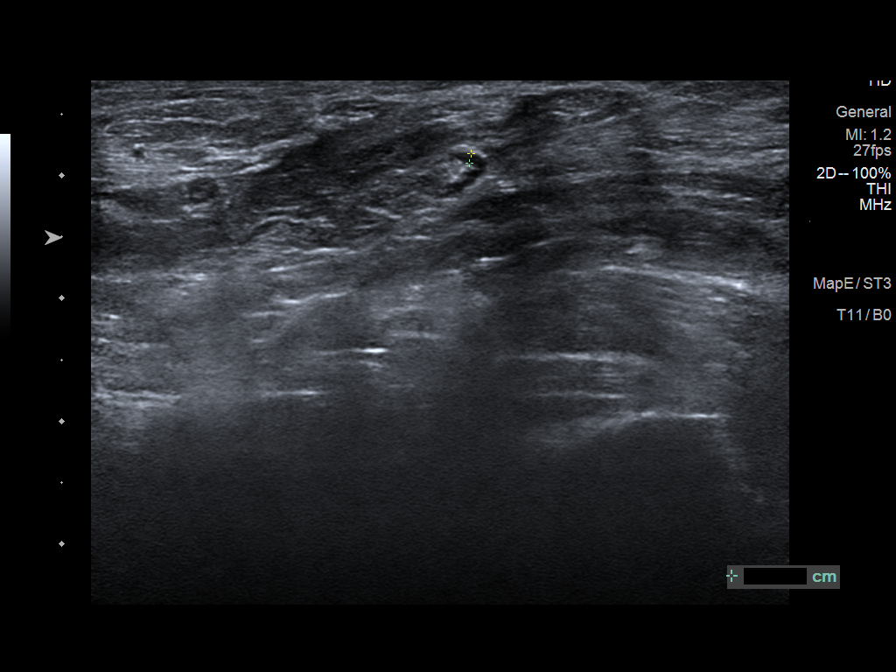

[8 of 8 positions shown; findings below may reference images not displayed]

ACR Breast Density Category b: There are scattered areas of
fibroglandular density.
FINDINGS: Bilateral diagnostic mammogram: There are no new dominant masses,
suspicious calcifications or secondary signs of malignancy within
either breast. No enlarged lymph nodes identified within the
visualized portions of each lower axillary region.

Targeted ultrasound is performed, evaluating the LEFT axilla,
showing several normal-appearing lymph nodes. No enlarged lymph
nodes. No solid or cystic mass within the LEFT axilla.
IMPRESSION: 1. No evidence of malignancy within either breast.
2. No lymphadenopathy within the LEFT axilla.

RECOMMENDATION:
Screening mammogram in one year.(Code:[id number])

I have discussed the findings and recommendations with the patient.
If applicable, a reminder letter will be sent to the patient
regarding the next appointment.

BI-RADS CATEGORY  1: Negative.

## 2022-03-29 DIAGNOSIS — R030 Elevated blood-pressure reading, without diagnosis of hypertension: Secondary | ICD-10-CM | POA: Diagnosis not present

## 2022-03-31 IMAGING — CT CT BIOPSY
1 of 2 series · 15 of 25 positions shown, 19 images · non-contrast
Comparison: none

INDICATION: Anemia of uncertain etiology. Please perform CT-guided bone marrow
biopsy for tissue diagnostic purposes.

[Series 2: i-spiral 5.0 br59 · axial · 0.90mm/px · z∈[-105,-49]mm · 15 of 19 slices shown, 19 images]
[im 2/19  mediastinal]
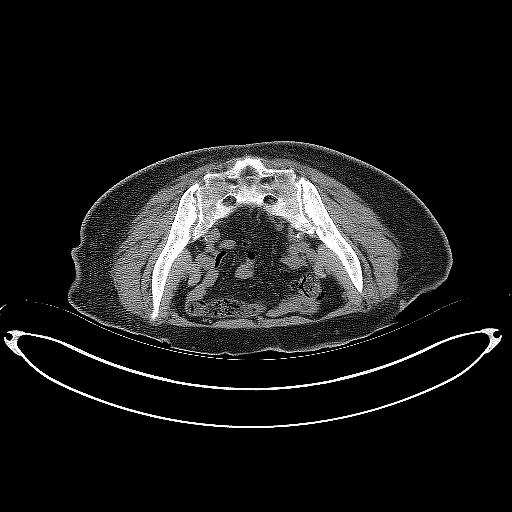
[im 2/19  lung]
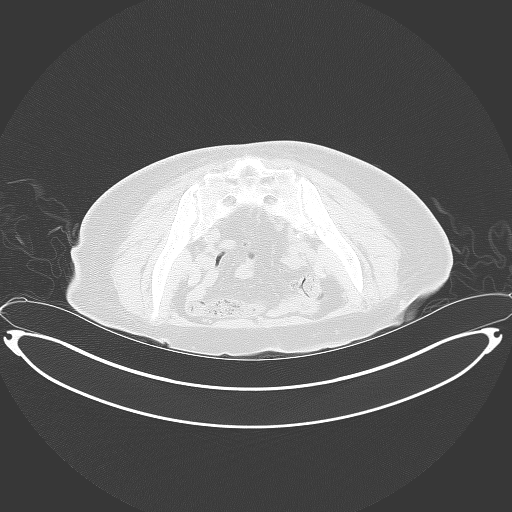
[im 3/19  lung]
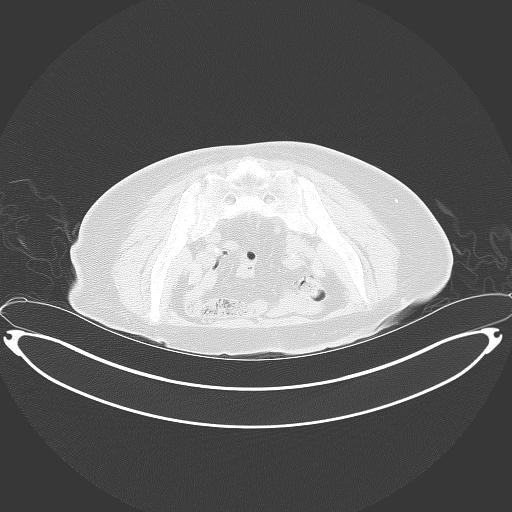
[im 4/19  lung]
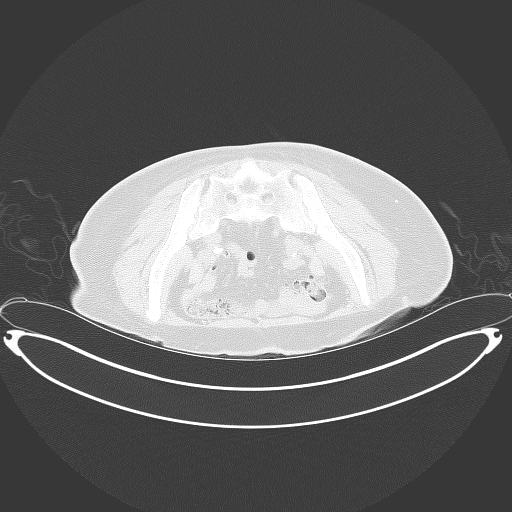
[im 5/19  lung]
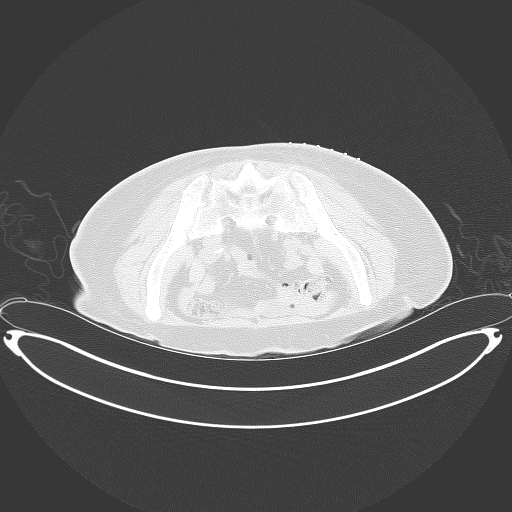
[im 7/19  mediastinal]
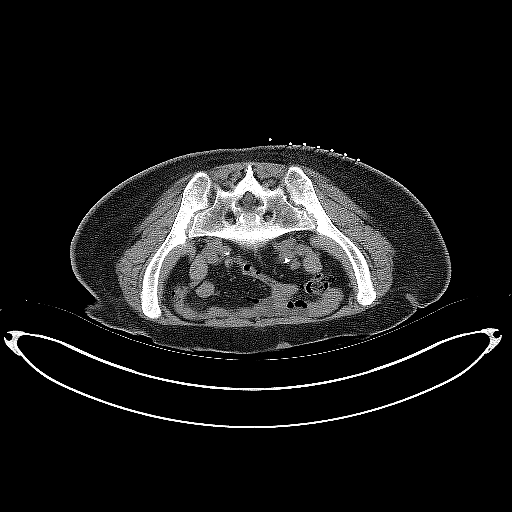
[im 7/19  lung]
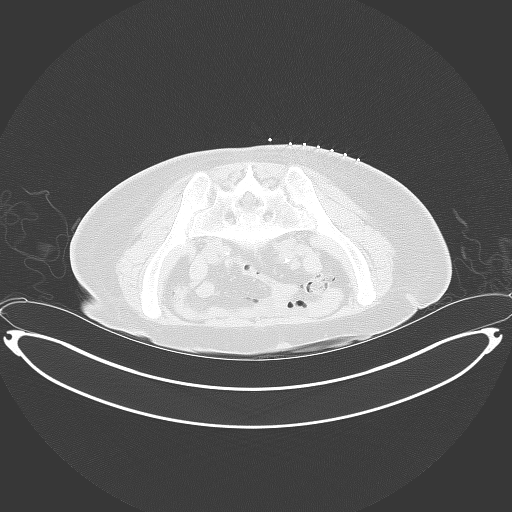
[im 8/19  lung]
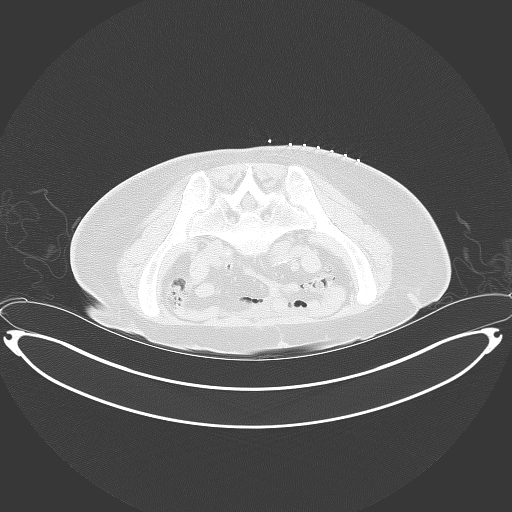
[im 9/19  lung]
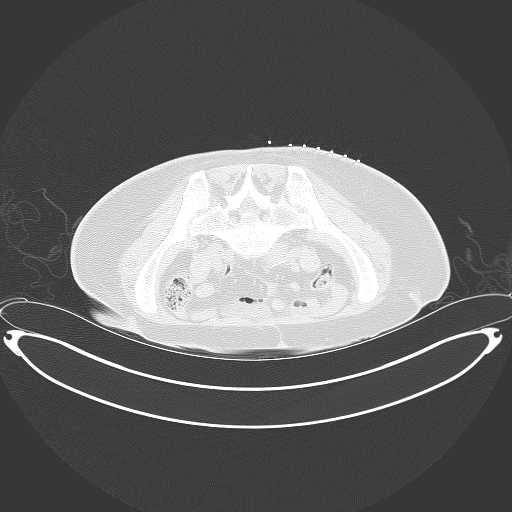
[im 10/19  lung]
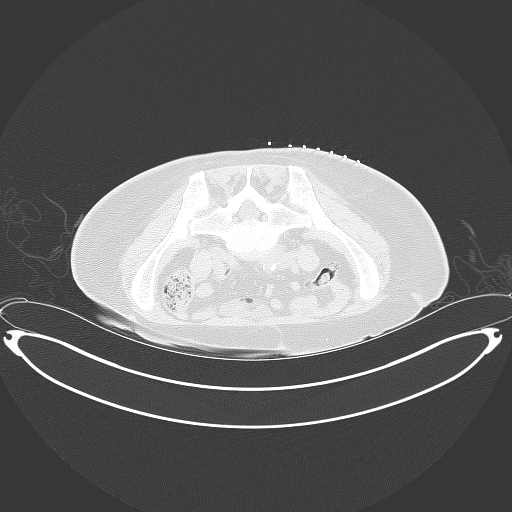
[im 11/19  mediastinal]
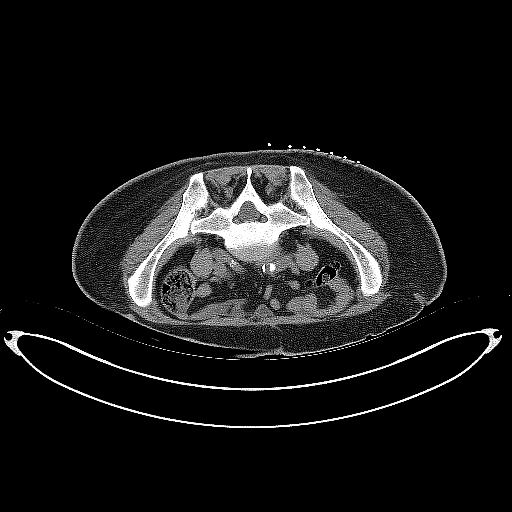
[im 11/19  lung]
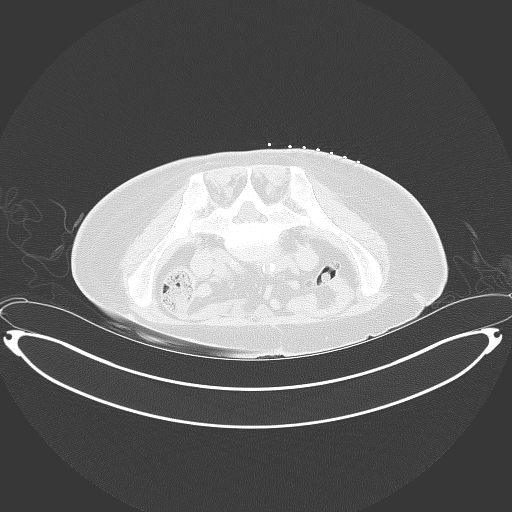
[im 12/19  lung]
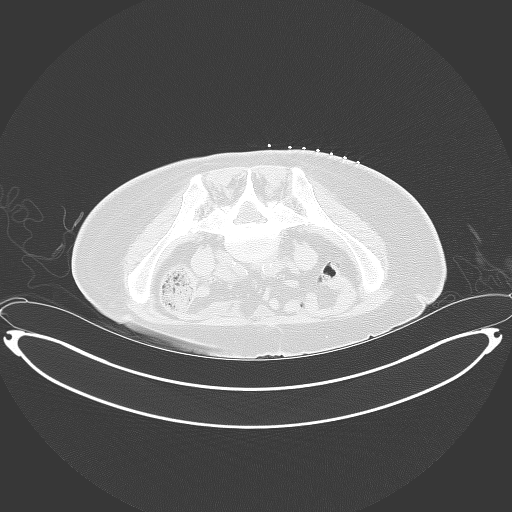
[im 13/19  lung]
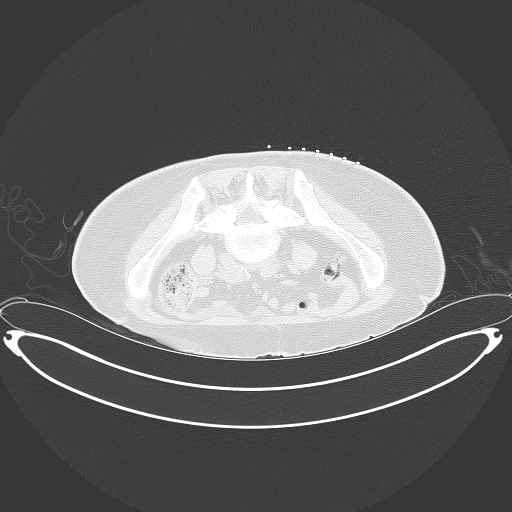
[im 15/19  lung]
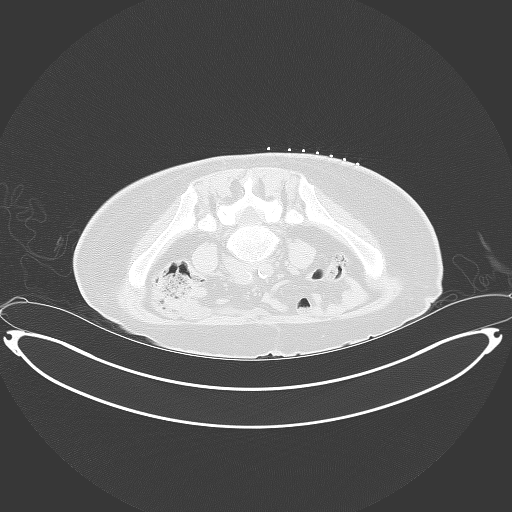
[im 16/19  mediastinal]
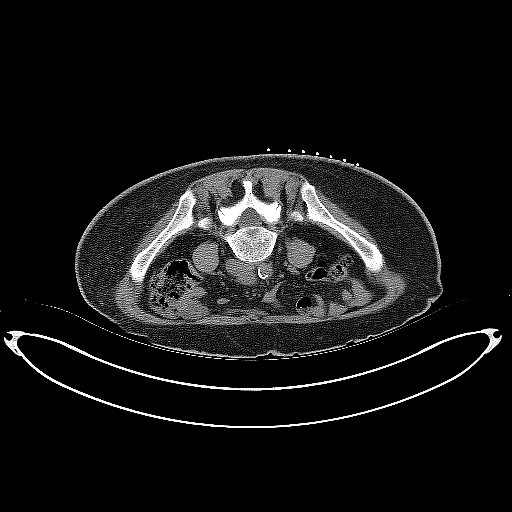
[im 16/19  lung]
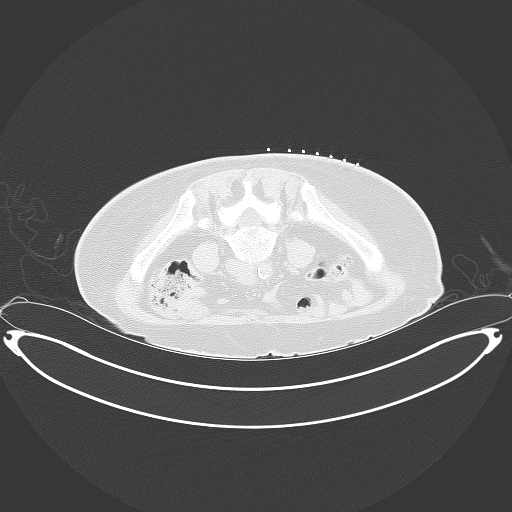
[im 17/19  lung]
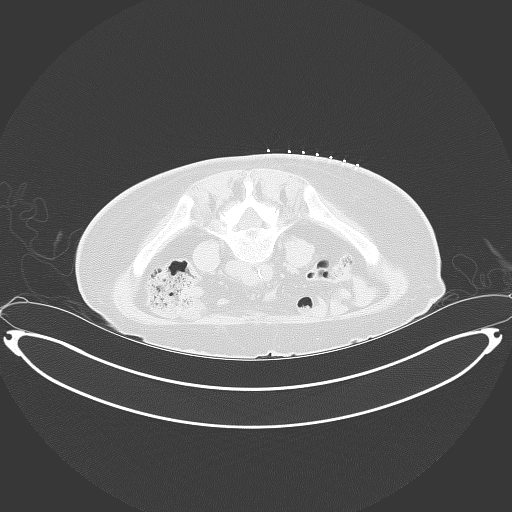
[im 18/19  lung]
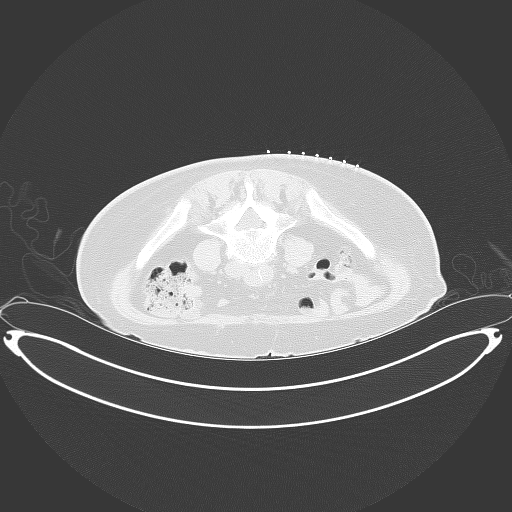

[15 of 25 positions shown; findings below may reference images not displayed]

EXAM:
CT-GUIDED BONE MARROW BIOPSY AND ASPIRATION

MEDICATIONS:
None

ANESTHESIA/SEDATION:
Fentanyl 75 mcg IV; Versed 1 mg IV

Sedation Time: 10 Minutes; The patient was continuously monitored
during the procedure by the interventional radiology nurse under my
direct supervision.

COMPLICATIONS:
None immediate.

PROCEDURE:
Informed consent was obtained from the patient following an
explanation of the procedure, risks, benefits and alternatives. The
patient understands, agrees and consents for the procedure. All
questions were addressed. A time out was performed prior to the
initiation of the procedure.

The patient was positioned prone and non-contrast localization CT
was performed of the pelvis to demonstrate the iliac marrow spaces.
The operative site was prepped and draped in the usual sterile
fashion.

Under sterile conditions and local anesthesia, a 22 gauge spinal
needle was utilized for procedural planning. Next, an 11 gauge
coaxial bone biopsy needle was advanced into the left iliac marrow
space. Needle position was confirmed with CT imaging. Initially, a
bone marrow aspiration was performed. Next, a bone marrow biopsy was
obtained with the 11 gauge outer bone marrow device. Samples were
prepared with the cytotechnologist and deemed adequate. The needle
was removed and superficial hemostasis was obtained with manual
compression. A dressing was applied. The patient tolerated the
procedure well without immediate post procedural complication.
IMPRESSION: Successful CT guided left iliac bone marrow aspiration and core
biopsy.

## 2022-04-04 IMAGING — CT CT CHEST-ABD-PELV W/ CM
2 of 5 series · 12 of 36 positions shown, 14 images · IV contrast (OMNIPAQUE)
Comparison: None.

CLINICAL DATA: Unexplained weight loss, severe anemia, evaluation
of occult malignancy

EXAM:
CT CHEST, ABDOMEN, AND PELVIS WITH CONTRAST
TECHNIQUE: Multidetector CT imaging of the chest, abdomen and pelvis was
performed following the standard protocol during bolus
administration of intravenous contrast.
CONTRAST:  75mL OMNIPAQUE IOHEXOL 300 MG/ML  SOLN

[Series 2: cap with · axial · 0.72mm/px · z∈[-780,-326]mm · 9 of 115 slices shown, 11 images]
[im 12/115  mediastinal]
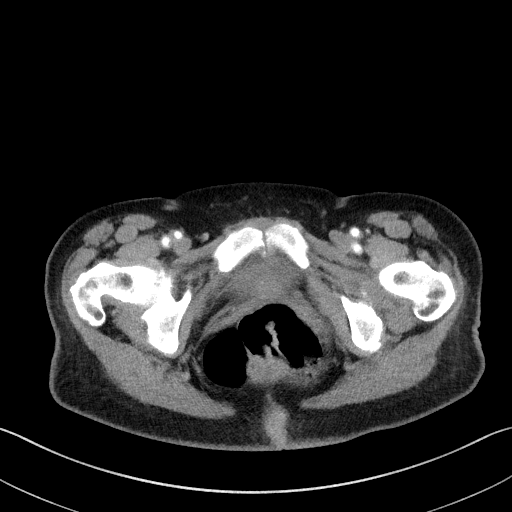
[im 12/115  bone]
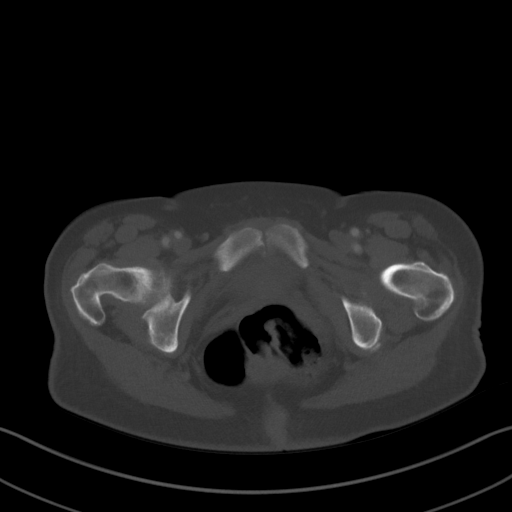
[im 23/115  mediastinal]
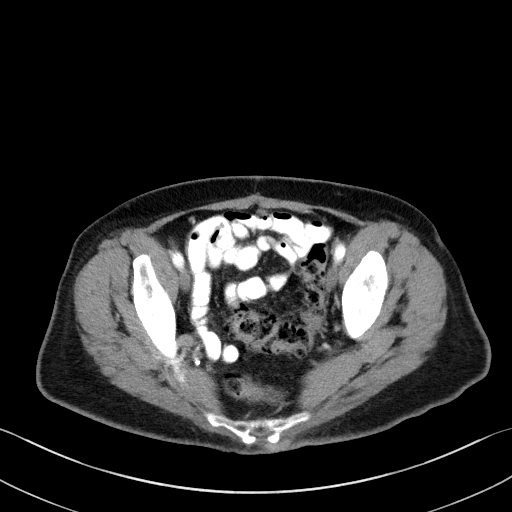
[im 35/115  mediastinal]
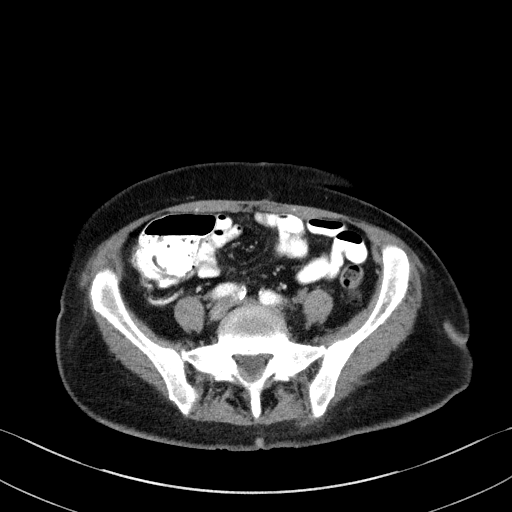
[im 46/115  mediastinal]
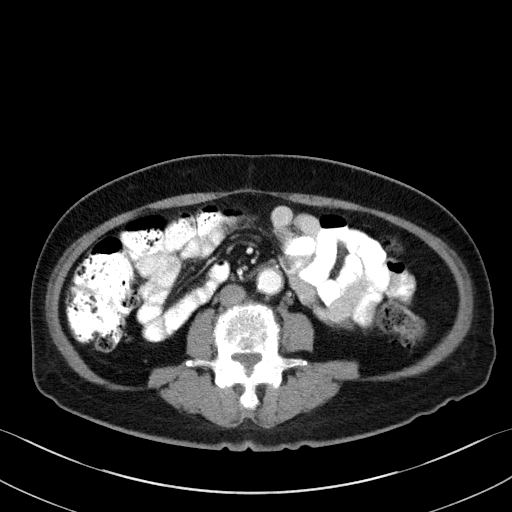
[im 58/115  mediastinal]
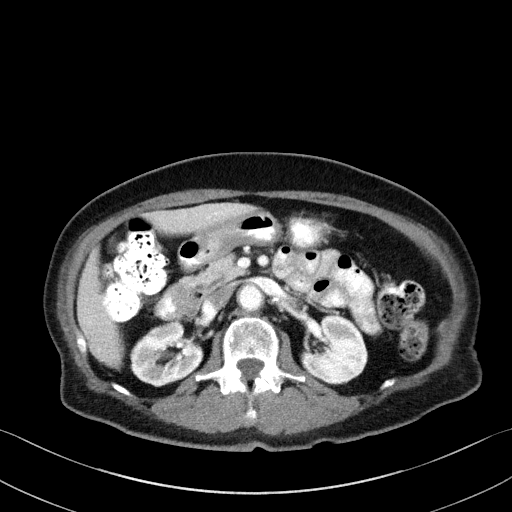
[im 69/115  mediastinal]
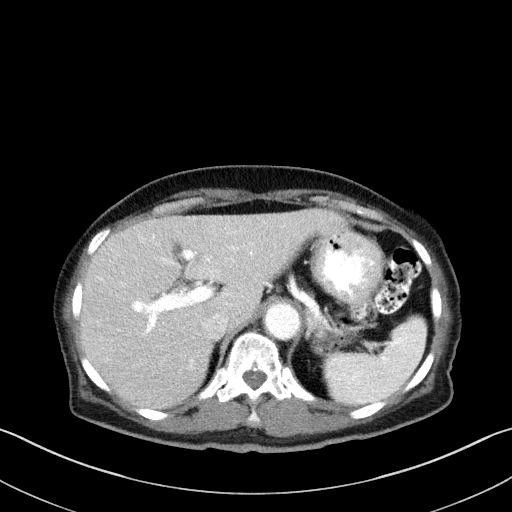
[im 80/115  mediastinal]
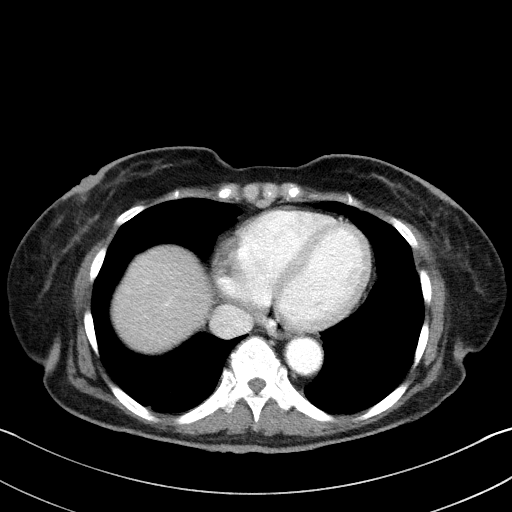
[im 92/115  mediastinal]
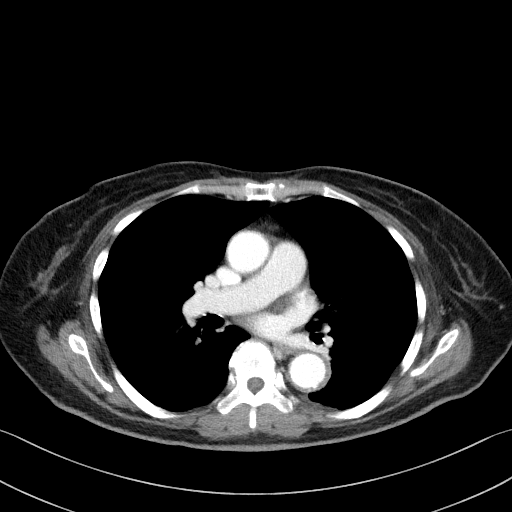
[im 103/115  mediastinal]
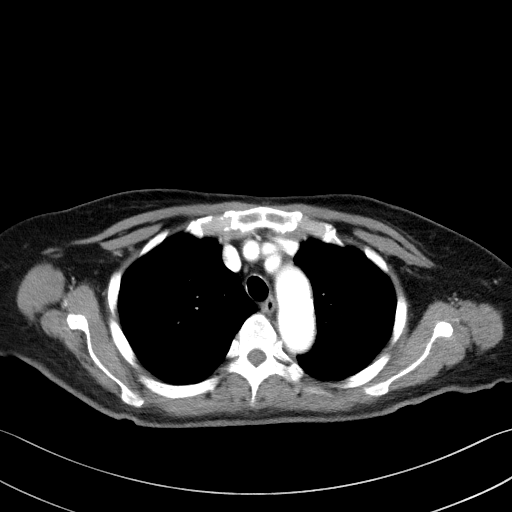
[im 103/115  bone]
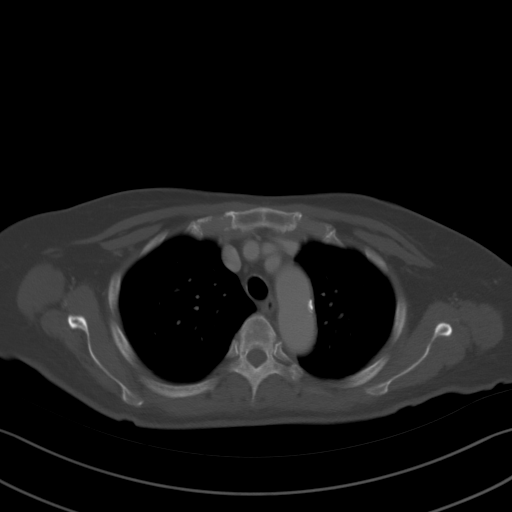

[Series 5: coronals · coronal · 0.67mm/px · 3 of 107 slices shown]
[im 22/107  mediastinal]
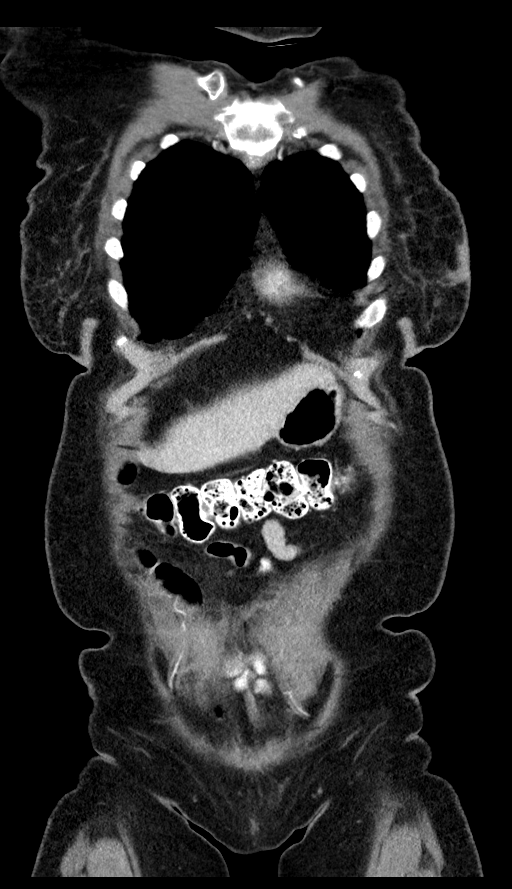
[im 43/107  mediastinal]
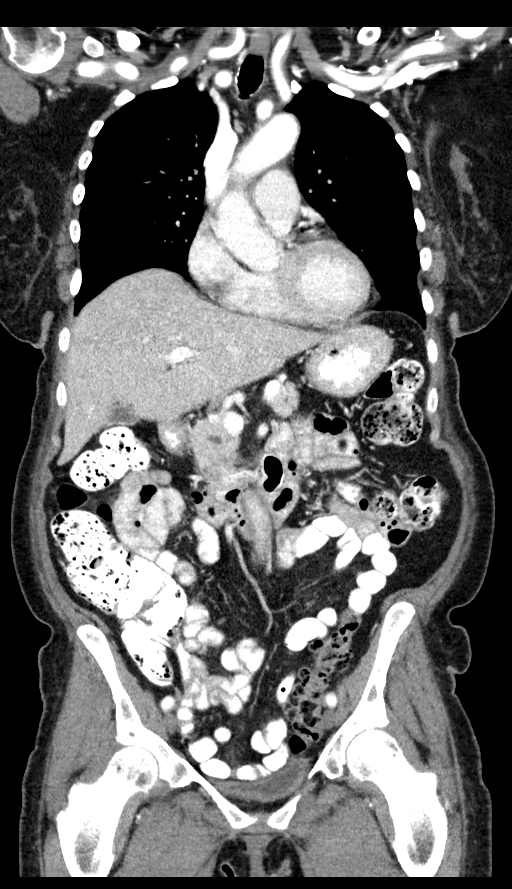
[im 64/107  mediastinal]
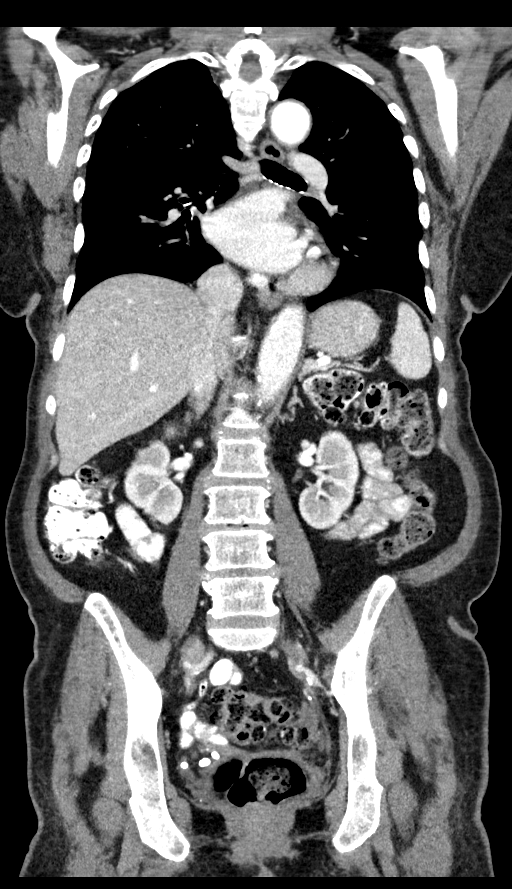

[12 of 36 positions shown; findings below may reference images not displayed]

FINDINGS: CT CHEST FINDINGS

Cardiovascular: Aortic atherosclerosis without aneurysmal dilation.
No central pulmonary embolus. Coronary artery calcifications. Normal
size heart. No significant pericardial effusion/thickening.

Mediastinum/Nodes: Multiple thyroid nodules the largest of which is
in the left lobe of the thyroid measuring 18 mm on image [DATE]. No
supraclavicular adenopathy. No pathologically enlarged mediastinal,
hilar or axillary lymph nodes. The trachea and esophagus are grossly
unremarkable.

Lungs/Pleura: There are few scattered tiny pulmonary nodules
measuring up to 2-3 mm for instance in the left lower lobe on image
49/4, in the right upper lobe on image 52/4 and in the right lower
lobe on image 59/4. Hypoventilatory change in the dependent lungs.
No focal airspace consolidation. No pleural effusion. No
pneumothorax.

Musculoskeletal: Multilevel degenerative change of the spine
including multiple Schmorl's nodes. No aggressive lytic or blastic
lesion of bone.

CT ABDOMEN PELVIS FINDINGS

Hepatobiliary: Hypodense 7 mm lesion in the peripheral right lobe of
the liver on image 45/2, which is technically too small to
accurately characterize but statistically most likely to represent a
cyst. No solid enhancing hepatic lesions. Gallbladder is
decompressed otherwise unremarkable. No biliary ductal dilation.

Pancreas: Prominence of the pancreatic duct in the head of the
pancreas without frank dilation. No surrounding inflammatory change.
No discrete pancreatic lesion visualized.

Spleen: Within normal limits.

Adrenals/Urinary Tract: Bilateral adrenal glands are unremarkable.

No hydronephrosis. Bilateral renal scarring. Otherwise symmetric
enhancement and excretion of contrast from the bilateral kidneys. No
solid enhancing renal masses.

Urinary bladder is grossly unremarkable for degree of distension.

Stomach/Bowel: Radiopaque enteric contrast traverses the hepatic
flexure. Stomach is grossly unremarkable for degree of distension.
No pathologic dilation small bowel. The appendix and terminal ileum
are grossly unremarkable. Colonic diverticulosis without findings of
acute diverticulitis.

Vascular/Lymphatic: Aortic atherosclerosis without aneurysmal
dilation. No pathologically enlarged abdominal or pelvic lymph
nodes.

Reproductive: Status post hysterectomy. No adnexal masses.

Other: No abdominopelvic ascites.

Musculoskeletal: Left iliac bone biopsy tract. Multilevel
degenerative changes spine. Degenerative changes bilateral hips and
SI joints. No acute osseous abnormality.
IMPRESSION: 1. Multiple thyroid nodules measuring up to 18 mm. Recommend further
evaluation with thyroid US (ref: [HOSPITAL]. [DATE]):
2. Scattered bilateral pulmonary nodules measuring up to 2-3 mm. No
follow-up needed if patient is low-risk (and has no known or
suspected primary neoplasm). Non-contrast chest CT can be considered
in 12 months if patient is high-risk. This recommendation follows
the consensus statement: Guidelines for Management of Incidental
Pulmonary Nodules Detected on CT Images: From the [HOSPITAL]
3. Colonic diverticulosis without findings of acute diverticulitis.
4. Hypodense 7 mm lesion in the peripheral right lobe of the liver
which is technically too small to accurately characterize but
statistically most likely to represent a cyst.
5.  Aortic Atherosclerosis ([id number]-[id number].O).

## 2022-04-19 IMAGING — US US THYROID
1 series · 12 of 25 positions shown · non-contrast
Comparison: None.

CLINICAL DATA: Thyroid nodules by chest CT

EXAM:
THYROID ULTRASOUND
TECHNIQUE: Ultrasound examination of the thyroid gland and adjacent soft
tissues was performed.

[Series 1: us thyroid · 12 of 61 slices shown]
[im 3/61]
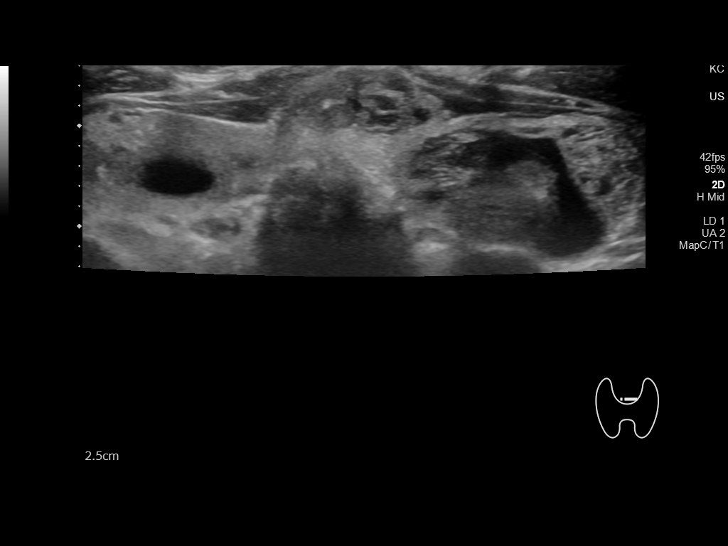
[im 8/61]
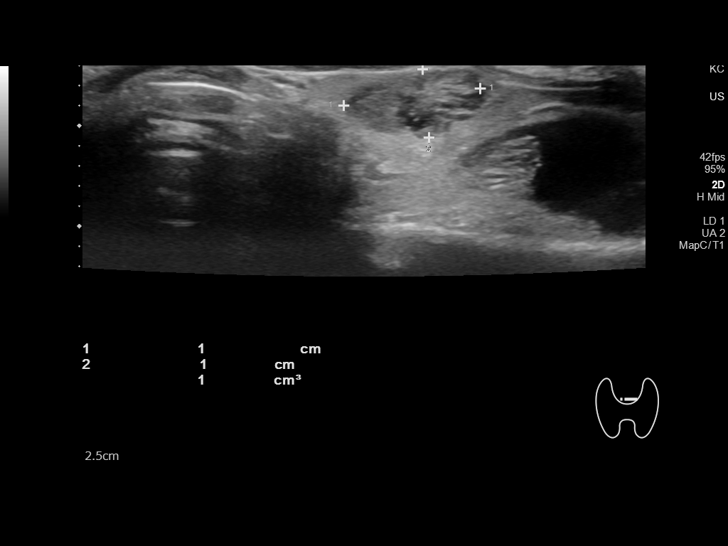
[im 13/61]
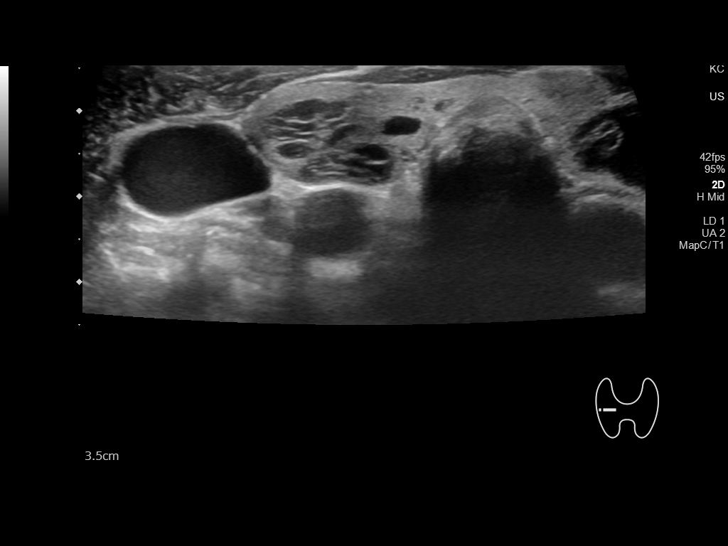
[im 18/61]
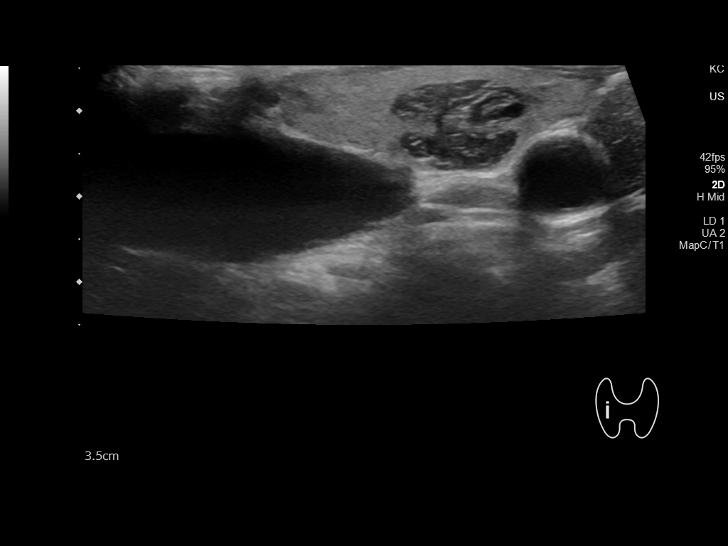
[im 23/61]
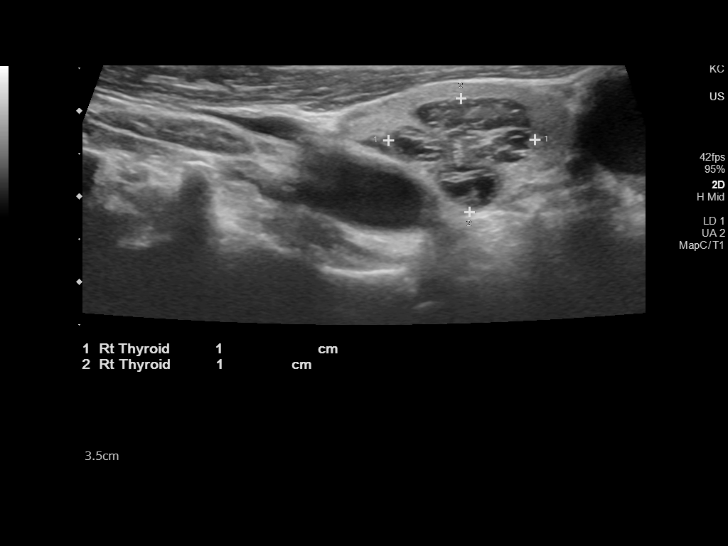
[im 28/61]
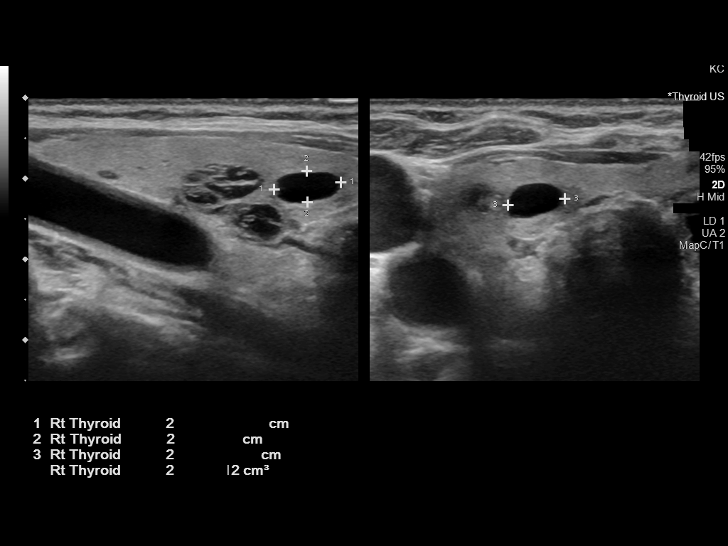
[im 33/61]
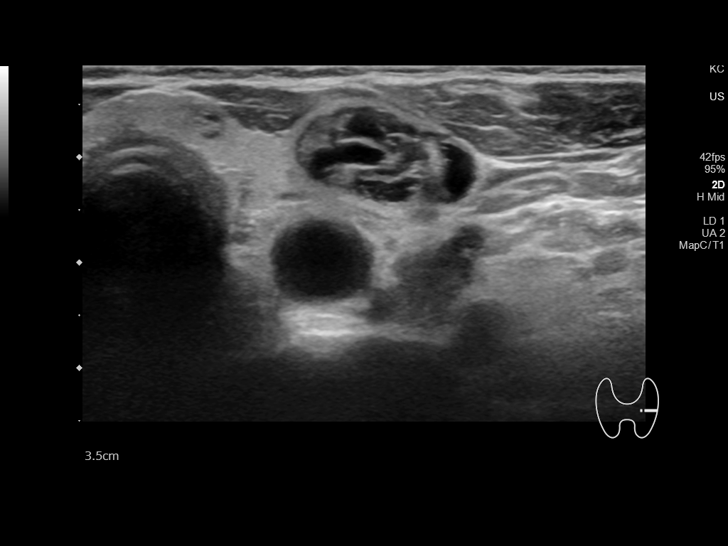
[im 38/61]
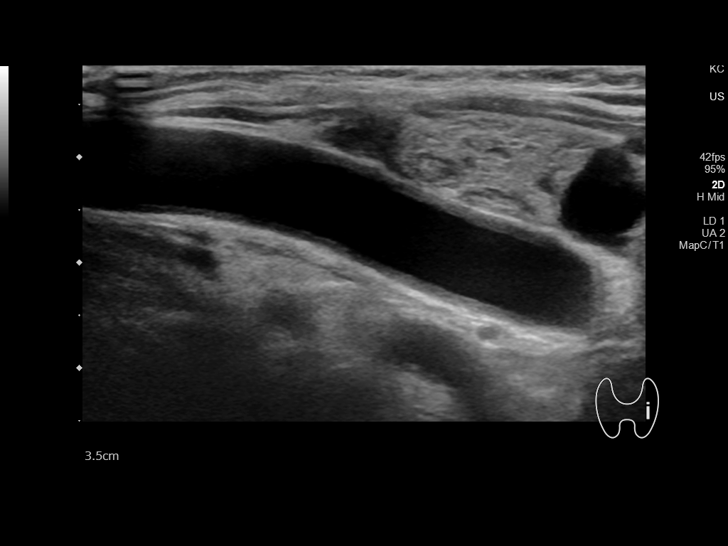
[im 43/61]
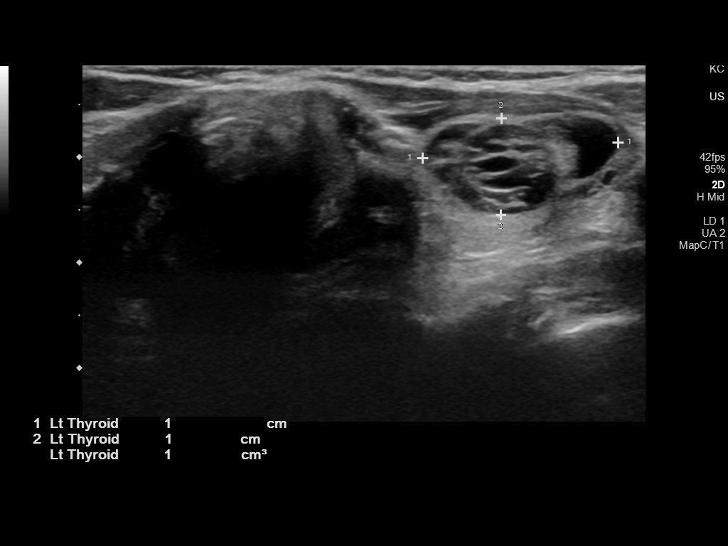
[im 48/61]
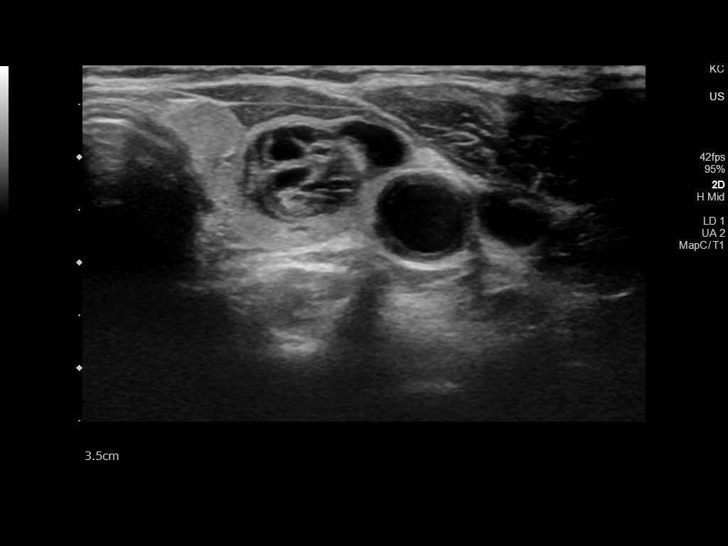
[im 53/61]
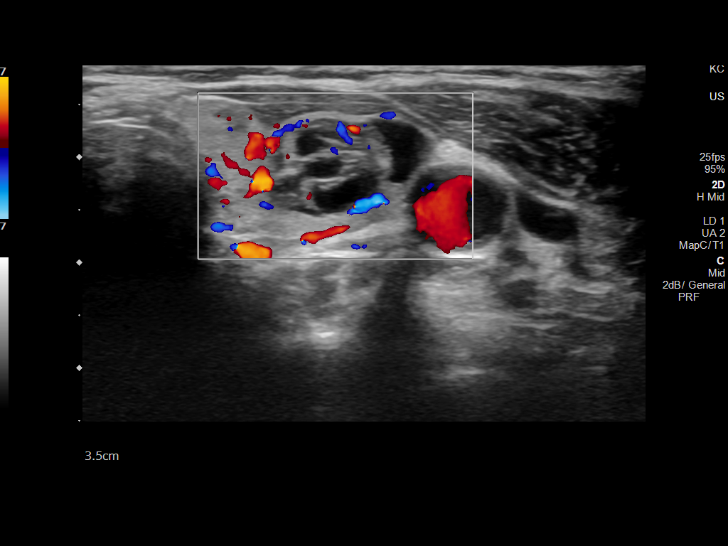
[im 58/61]
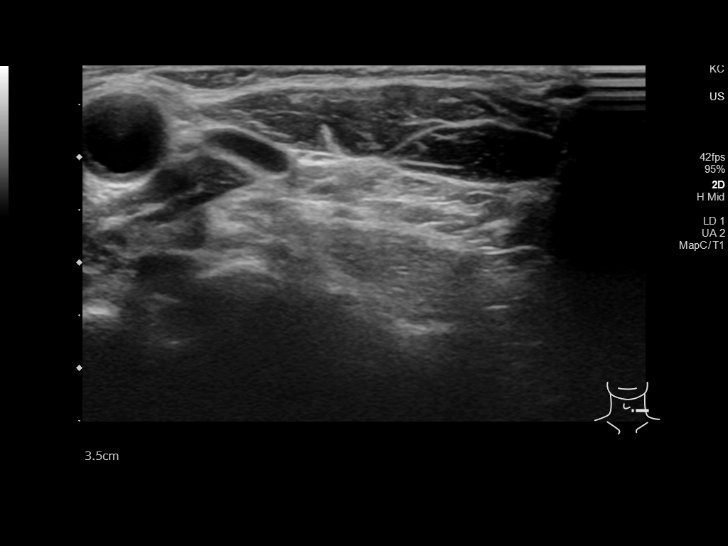

[12 of 25 positions shown; findings below may reference images not displayed]

FINDINGS: Parenchymal Echotexture: Moderately heterogenous

Isthmus: 7 mm

Right lobe: 5.9 x 1.3 x 2.1 cm

Left lobe: 4.7 x 1.5 x 2.5 cm

_________________________________________________________

Estimated total number of nodules >/= 1 cm: 4

Number of spongiform nodules >/=  2 cm not described below (TR1): 0

Number of mixed cystic and solid nodules >/= 1.5 cm not described
below (TR2): 0

_________________________________________________________

Nodule # 1:

Location: Isthmus; Mid

Maximum size: 1.4 cm; Other 2 dimensions: 0.7 x 1.4 cm

Composition: mixed cystic and solid (1)

Echogenicity: hypoechoic (2)

Shape: not taller-than-wide (0)

Margins: ill-defined (0)

Echogenic foci: none (0)

ACR TI-RADS total points: 3.

ACR TI-RADS risk category: TR3 (3 points).

ACR TI-RADS recommendations:

Given size (<1.4 cm) and appearance, this nodule does NOT meet
TI-RADS criteria for biopsy or dedicated follow-up.

_________________________________________________________

Nodule # 2:

Location: Right; Mid

Maximum size: 1.7 cm; Other 2 dimensions: 1.3 x 1.2 cm

Composition: spongiform (0)

Echogenicity: isoechoic (1)

Shape: not taller-than-wide (0)

Margins: ill-defined (0)

Echogenic foci: none (0)

ACR TI-RADS total points: 1.

ACR TI-RADS risk category: TR1 (0-1 points).

ACR TI-RADS recommendations:

This nodule does NOT meet TI-RADS criteria for biopsy or dedicated
follow-up.

_________________________________________________________

Nodule # 3:

Location: Left; Mid

Maximum size: 1.9 cm; Other 2 dimensions: 0.9 x 1.6 cm

Composition: mixed cystic and solid (1)

Echogenicity: isoechoic (1)

Shape: not taller-than-wide (0)

Margins: smooth (0)

Echogenic foci: none (0)

ACR TI-RADS total points: 2.

ACR TI-RADS risk category: TR2 (2 points).

ACR TI-RADS recommendations:

This nodule does NOT meet TI-RADS criteria for biopsy or dedicated
follow-up.

_________________________________________________________

Nodule # 4:

Location: Left; Inferior

Maximum size: 2.3 cm; Other 2 dimensions: 1.6 x 2.1 cm

Composition: mixed cystic and solid (1)

Echogenicity: isoechoic (1)

Shape: not taller-than-wide (0)

Margins: smooth (0)

Echogenic foci: none (0)

ACR TI-RADS total points: 2.

ACR TI-RADS risk category: TR2 (2 points).

ACR TI-RADS recommendations:

This nodule does NOT meet TI-RADS criteria for biopsy or dedicated
follow-up.

_________________________________________________________

There are additional subcentimeter benign cystic nodules noted which
are not fully described by TI rads criteria. Dominant nodules as
above. No adenopathy.
IMPRESSION: 1.4 cm mid isthmus TR 3 nodule does not meet criteria for biopsy or
follow-up.

Additional bilateral TR 1 and TR 2 nodules which also do not meet
criteria for biopsy or follow-up.

The above is in keeping with the ACR TI-RADS recommendations - [HOSPITAL] [id number];[DATE].

## 2022-04-27 DIAGNOSIS — H40023 Open angle with borderline findings, high risk, bilateral: Secondary | ICD-10-CM | POA: Diagnosis not present

## 2022-05-05 DIAGNOSIS — M25529 Pain in unspecified elbow: Secondary | ICD-10-CM | POA: Diagnosis not present

## 2022-05-05 DIAGNOSIS — R5383 Other fatigue: Secondary | ICD-10-CM | POA: Diagnosis not present

## 2022-05-05 DIAGNOSIS — M542 Cervicalgia: Secondary | ICD-10-CM | POA: Diagnosis not present

## 2022-05-05 DIAGNOSIS — M549 Dorsalgia, unspecified: Secondary | ICD-10-CM | POA: Diagnosis not present

## 2022-05-05 DIAGNOSIS — M150 Primary generalized (osteo)arthritis: Secondary | ICD-10-CM | POA: Diagnosis not present

## 2022-05-05 DIAGNOSIS — M0609 Rheumatoid arthritis without rheumatoid factor, multiple sites: Secondary | ICD-10-CM | POA: Diagnosis not present

## 2022-05-05 DIAGNOSIS — M79643 Pain in unspecified hand: Secondary | ICD-10-CM | POA: Diagnosis not present

## 2022-05-05 DIAGNOSIS — M810 Age-related osteoporosis without current pathological fracture: Secondary | ICD-10-CM | POA: Diagnosis not present

## 2022-05-05 DIAGNOSIS — Z23 Encounter for immunization: Secondary | ICD-10-CM | POA: Diagnosis not present

## 2022-05-05 DIAGNOSIS — D649 Anemia, unspecified: Secondary | ICD-10-CM | POA: Diagnosis not present

## 2022-05-05 DIAGNOSIS — M316 Other giant cell arteritis: Secondary | ICD-10-CM | POA: Diagnosis not present

## 2022-05-25 DIAGNOSIS — R350 Frequency of micturition: Secondary | ICD-10-CM | POA: Diagnosis not present

## 2022-06-28 DIAGNOSIS — H6123 Impacted cerumen, bilateral: Secondary | ICD-10-CM | POA: Diagnosis not present

## 2022-06-28 DIAGNOSIS — H61301 Acquired stenosis of right external ear canal, unspecified: Secondary | ICD-10-CM | POA: Diagnosis not present

## 2022-08-03 DIAGNOSIS — Z7952 Long term (current) use of systemic steroids: Secondary | ICD-10-CM | POA: Diagnosis not present

## 2022-08-03 DIAGNOSIS — H04123 Dry eye syndrome of bilateral lacrimal glands: Secondary | ICD-10-CM | POA: Diagnosis not present

## 2022-08-03 DIAGNOSIS — M316 Other giant cell arteritis: Secondary | ICD-10-CM | POA: Diagnosis not present

## 2022-08-03 DIAGNOSIS — H40013 Open angle with borderline findings, low risk, bilateral: Secondary | ICD-10-CM | POA: Diagnosis not present

## 2022-08-03 DIAGNOSIS — H524 Presbyopia: Secondary | ICD-10-CM | POA: Diagnosis not present

## 2022-08-03 DIAGNOSIS — I10 Essential (primary) hypertension: Secondary | ICD-10-CM | POA: Diagnosis not present

## 2022-08-03 DIAGNOSIS — M810 Age-related osteoporosis without current pathological fracture: Secondary | ICD-10-CM | POA: Diagnosis not present

## 2022-08-03 DIAGNOSIS — E7800 Pure hypercholesterolemia, unspecified: Secondary | ICD-10-CM | POA: Diagnosis not present

## 2022-08-03 DIAGNOSIS — D509 Iron deficiency anemia, unspecified: Secondary | ICD-10-CM | POA: Diagnosis not present

## 2022-08-03 DIAGNOSIS — H43393 Other vitreous opacities, bilateral: Secondary | ICD-10-CM | POA: Diagnosis not present

## 2022-08-03 DIAGNOSIS — H35011 Changes in retinal vascular appearance, right eye: Secondary | ICD-10-CM | POA: Diagnosis not present

## 2022-08-09 ENCOUNTER — Other Ambulatory Visit: Payer: Self-pay

## 2022-08-09 ENCOUNTER — Emergency Department (HOSPITAL_COMMUNITY)
Admission: EM | Admit: 2022-08-09 | Discharge: 2022-08-10 | Disposition: A | Discharge status: Home / Self Care | Payer: Medicare Other | Attending: Emergency Medicine | Admitting: Emergency Medicine

## 2022-08-09 DIAGNOSIS — Z79899 Other long term (current) drug therapy: Secondary | ICD-10-CM | POA: Insufficient documentation

## 2022-08-09 DIAGNOSIS — M25511 Pain in right shoulder: Secondary | ICD-10-CM | POA: Diagnosis not present

## 2022-08-09 DIAGNOSIS — R001 Bradycardia, unspecified: Secondary | ICD-10-CM | POA: Diagnosis not present

## 2022-08-09 DIAGNOSIS — R519 Headache, unspecified: Secondary | ICD-10-CM | POA: Insufficient documentation

## 2022-08-09 DIAGNOSIS — I10 Essential (primary) hypertension: Secondary | ICD-10-CM | POA: Insufficient documentation

## 2022-08-09 NOTE — ED Triage Notes (Signed)
Pt states that she has been having a headache for the past 4 weeks, tonight it was worse. States she felt like she was "going to pass out when she was laying down"  Pt also states her R shoulder has been hurting for the past few days, denies injury or heavy lifting

## 2022-08-10 ENCOUNTER — Emergency Department (HOSPITAL_COMMUNITY): Payer: Medicare Other

## 2022-08-10 DIAGNOSIS — R519 Headache, unspecified: Secondary | ICD-10-CM | POA: Diagnosis not present

## 2022-08-10 DIAGNOSIS — M25511 Pain in right shoulder: Secondary | ICD-10-CM | POA: Diagnosis not present

## 2022-08-10 LAB — URINALYSIS, ROUTINE W REFLEX MICROSCOPIC
Bacteria, UA: NONE SEEN
Bilirubin Urine: NEGATIVE
Glucose, UA: NEGATIVE mg/dL
Ketones, ur: NEGATIVE mg/dL
Leukocytes,Ua: NEGATIVE
Nitrite: NEGATIVE
Protein, ur: NEGATIVE mg/dL
Specific Gravity, Urine: 1.002 — ABNORMAL LOW (ref 1.005–1.030)
pH: 6 (ref 5.0–8.0)

## 2022-08-10 LAB — BASIC METABOLIC PANEL
Anion gap: 9 (ref 5–15)
BUN: 19 mg/dL (ref 8–23)
CO2: 22 mmol/L (ref 22–32)
Calcium: 9.5 mg/dL (ref 8.9–10.3)
Chloride: 106 mmol/L (ref 98–111)
Creatinine, Ser: 1.19 mg/dL — ABNORMAL HIGH (ref 0.44–1.00)
GFR, Estimated: 46 mL/min — ABNORMAL LOW (ref >60)
Glucose, Bld: 113 mg/dL — ABNORMAL HIGH (ref 70–99)
Potassium: 3.7 mmol/L (ref 3.5–5.1)
Sodium: 137 mmol/L (ref 135–145)

## 2022-08-10 LAB — TROPONIN I (HIGH SENSITIVITY)
Troponin I (High Sensitivity): 6 ng/L (ref <18)
Troponin I (High Sensitivity): 6 ng/L (ref <18)

## 2022-08-10 LAB — CBC
HCT: 34.5 % — ABNORMAL LOW (ref 36.0–46.0)
Hemoglobin: 10.7 g/dL — ABNORMAL LOW (ref 12.0–15.0)
MCH: 28.1 pg (ref 26.0–34.0)
MCHC: 31 g/dL (ref 30.0–36.0)
MCV: 90.6 fL (ref 80.0–100.0)
Platelets: 257 10*3/uL (ref 150–400)
RBC: 3.81 MIL/uL — ABNORMAL LOW (ref 3.87–5.11)
RDW: 14.5 % (ref 11.5–15.5)
WBC: 5.7 10*3/uL (ref 4.0–10.5)
nRBC: 0 % (ref 0.0–0.2)

## 2022-08-10 MED ORDER — METOCLOPRAMIDE HCL 5 MG/ML IJ SOLN
5.0000 mg | Freq: Once | INTRAMUSCULAR | Status: AC
  Administered 2022-08-10: 5 mg via INTRAVENOUS
  Filled 2022-08-10: qty 2

## 2022-08-10 MED ORDER — CYCLOBENZAPRINE HCL 10 MG PO TABS
5.0000 mg | ORAL_TABLET | Freq: Once | ORAL | Status: AC
  Administered 2022-08-10: 5 mg via ORAL
  Filled 2022-08-10: qty 1

## 2022-08-10 MED ORDER — SODIUM CHLORIDE 0.9 % IV BOLUS
500.0000 mL | Freq: Once | INTRAVENOUS | Status: AC
  Administered 2022-08-10: 500 mL via INTRAVENOUS

## 2022-08-10 NOTE — ED Provider Notes (Signed)
[place] EMERGENCY DEPARTMENT AT [facility] Provider Note   CSN: [id number] Arrival date & time: 08/09/22  2331     History  Chief Complaint  Patient presents with   Headache    Lori Sanchez is a 82 y.o. female.  HPI   Patient with medical history including hypertension, hyperlipidemia, arthritis, GERD, presented with complaints of right sided head pressure, been going for about a week's time, states it waxes and wanes in intensity, no associated change in vision, paresthesias, weakness in the upper and/or lower extremities, no recent head trauma, not anticoag's.  She states that does not feel like her typical headaches, not noticing any neck pain, no dizziness, lightheadedness chest pain or shortness of breath.  She does note that she has some right shoulder pain, states that this is gone for a while, thinks it is from arthritis, no trauma to it.  Home Medications Prior to Admission medications   Medication Sig Start Date End Date Taking? Authorizing Provider  alendronate (FOSAMAX) 70 MG tablet TAKE 1 TABLET BY MOUTH  WEEKLY 1/2 HOUR BEFORE THE  FIRST FOOD, BEVERAGE OR  MEDICINE OF THE DAY WITH  PLAIN WATER    [provider]  amLODipine (NORVASC) 5 MG tablet Take 2 tablets (10 mg total) by mouth daily. 09/02/11   Gerhard Munch, MD  Iron, Ferrous Sulfate, 325 (65 Fe) MG TABS Take by mouth 2 (two) times daily.    [provider]  losartan (COZAAR) 100 MG tablet Take 100 mg by mouth daily.    [provider]  pantoprazole (PROTONIX) 40 MG tablet Take 1 tablet by mouth 2 (two) times daily. 08/28/20   [provider]      Allergies    Patient has no known allergies.    Review of Systems   Review of Systems  Constitutional:  Negative for chills and fever.  Respiratory:  Negative for shortness of breath.   Cardiovascular:  Negative for chest pain.  Gastrointestinal:  Negative for abdominal pain.  Musculoskeletal:        Right  shoulder pain  Neurological:  Positive for headaches.    Physical Exam Updated Vital Signs BP (!) 178/81 (BP Location: Left Arm)   Pulse 64   Temp 98.6 F (37 C) (Oral)   Resp 17   SpO2 100%  Physical Exam Vitals and nursing note reviewed.  Constitutional:      General: She is not in acute distress.    Appearance: She is not ill-appearing.  HENT:     Head: Normocephalic and atraumatic.     Nose: No congestion.  Eyes:     Conjunctiva/sclera: Conjunctivae normal.  Cardiovascular:     Rate and Rhythm: Normal rate and regular rhythm.     Pulses: Normal pulses.     Heart sounds: No murmur heard.    No friction rub. No gallop.  Pulmonary:     Effort: No respiratory distress.     Breath sounds: No wheezing, rhonchi or rales.  Musculoskeletal:     Comments: No deformity of the right shoulder, slightly tender to palpation along the anterior aspect of the deltoid, she has full range of motion at shoulder, compartments are soft throughout the right arm, she is 2+ radial pulses, sensation tact light touch, 2-second capillary refill.  Skin:    General: Skin is warm and dry.  Neurological:     Mental Status: She is alert.     GCS: GCS eye subscore is 4. GCS verbal  subscore is 5. GCS motor subscore is 6.     Cranial Nerves: No cranial nerve deficit.     Motor: Motor function is intact.     Coordination: Romberg sign negative. Finger-Nose-Finger Test normal.     Comments: Cranial nerves II through XII grossly intact no difficulty with word finding following two-step commands there is no unilateral weakness present.  Psychiatric:        Mood and Affect: Mood normal.     ED Results / Procedures / Treatments   Labs (all labs ordered are listed, but only abnormal results are displayed) Labs Reviewed  BASIC METABOLIC PANEL - Abnormal; Notable for the following components:      Result Value   Glucose, Bld 113 (*)    Creatinine, Ser 1.19 (*)    GFR, Estimated 46 (*)    All other  components within normal limits  CBC - Abnormal; Notable for the following components:   RBC 3.81 (*)    Hemoglobin 10.7 (*)    HCT 34.5 (*)    All other components within normal limits  URINALYSIS, ROUTINE W REFLEX MICROSCOPIC - Abnormal; Notable for the following components:   Color, Urine COLORLESS (*)    Specific Gravity, Urine 1.002 (*)    Hgb urine dipstick SMALL (*)    All other components within normal limits  TROPONIN I (HIGH SENSITIVITY)  TROPONIN I (HIGH SENSITIVITY)    EKG None  Radiology DG Shoulder Right  Result Date: 08/10/2022 CLINICAL DATA:  Right shoulder pain. EXAM: RIGHT SHOULDER - 3 VIEW COMPARISON:  None Available. FINDINGS: There is no evidence of fracture or dislocation. Subjective osteopenia. Degenerative spurring at the glenohumeral joint. IMPRESSION: No acute finding. Glenohumeral osteoarthritis. Electronically Signed   By: Tiburcio Pea M.D.   On: 08/10/2022 05:50   CT Head Wo Contrast  Result Date: 08/10/2022 CLINICAL DATA:  Headache for 4 weeks, worse tonight. EXAM: CT HEAD WITHOUT CONTRAST TECHNIQUE: Contiguous axial images were obtained from the base of the skull through the vertex without intravenous contrast. RADIATION DOSE REDUCTION: This exam was performed according to the departmental dose-optimization program which includes automated exposure control, adjustment of the mA and/or kV according to patient size and/or use of iterative reconstruction technique. COMPARISON:  04/01/2014 FINDINGS: Brain: No evidence of acute infarction, hemorrhage, hydrocephalus, extra-axial collection or mass lesion/mass effect. Generalized cerebral volume loss. Mild for age chronic small vessel ischemia in the cerebral white matter. No significant change since 2015. Vascular: No hyperdense vessel or unexpected calcification. Skull: Normal. Negative for fracture or focal lesion. Sinuses/Orbits: Remote blowout fracture of the left orbital floor. No acute or inflammatory  findings. IMPRESSION: No acute or reversible finding.  No specific cause for symptoms. Electronically Signed   By: Tiburcio Pea M.D.   On: 08/10/2022 05:47    Procedures Procedures    Medications Ordered in ED Medications  metoCLOPramide (REGLAN) injection 5 mg (5 mg Intravenous Given 08/10/22 0552)  cyclobenzaprine (FLEXERIL) tablet 5 mg (5 mg Oral Given 08/10/22 0553)  sodium chloride 0.9 % bolus 500 mL (500 mLs Intravenous New Bag/Given 08/10/22 0554)    ED Course/ Medical Decision Making/ A&P                             Medical Decision Making Amount and/or Complexity of Data Reviewed Labs: ordered. Radiology: ordered.  Risk Prescription drug management.   This patient presents to the ED for concern of headache, this involves  an extensive number of treatment options, and is a complaint that carries with it a high risk of complications and morbidity.  The differential diagnosis includes intracranial bleed, CVA, dissection    Additional history obtained:  Additional history obtained from N/A External records from outside source obtained and reviewed including PCP notes   Co morbidities that complicate the patient evaluation  Hypertension  Social Determinants of Health:  N/A    Lab Tests:  I Ordered, and personally interpreted labs.  The pertinent results include: CBC shows normocytic anemia hemoglobin 10.7, BMP reveals glucose of 113, creatinine 1.19, GFR 46, negative delta troponin, UA unremarkable   Imaging Studies ordered:  I ordered imaging studies including CT head, x-ray of right shoulder I independently visualized and interpreted imaging which showed negative for acute finding I agree with the radiologist interpretation   Cardiac Monitoring:  The patient was maintained on a cardiac monitor.  I personally viewed and interpreted the cardiac monitored which showed an underlying rhythm of: Sinus without signs of ischemia   Medicines ordered and  prescription drug management:  I ordered medication including migraine cocktail I have reviewed the patients home medicines and have made adjustments as needed  Critical Interventions:  N/A   Reevaluation:  Presents with a headache, triage obtain basic lab workup which I personally viewed unremarkable, we will add on CT head for atypical headaches, as well as an x-ray of the right shoulder rule out possible fracture, provide with a migraine cocktail, and reassess.  Reassessed the patient, states she is feeling better, agreement discharge at this time    Consultations Obtained:  N/a   Test Considered:  N/a    Rule out low suspicion for internal head bleed and or mass as CT imaging is negative for acute findings.  Low suspicion for CVA she has no focal deficit present my exam.  Low suspicion for dissection of the vertebral or carotid artery as presentation atypical of etiology.  Low suspicion for meningitis as she has no meningeal sign present.     Dispostion and problem list  After consideration of the diagnostic results and the patients response to treatment, I feel that the patent would benefit from discharge.  Headache-suspect tension-like headache, follow-up with neurology for further evaluation Shoulder pain-likely from OA follow-up with orthopedics for further assessment strict return precautions.            Final Clinical Impression(s) / ED Diagnoses Final diagnoses:  Bad headache  Acute pain of right shoulder    Rx / DC Orders ED Discharge Orders     None         Carroll Sage, PA-C 08/10/22 1610    Geoffery Lyons, MD 08/12/22 [phone]

## 2022-08-10 NOTE — Discharge Instructions (Addendum)
Lab work imaging look reassuring please continue with over-the-counter pain medication as needed for your headache as well as your shoulder pain.  Please follow-up with neurology for your headaches  Please follow-up with orthopedics for your right shoulder pain  Come back to the emergency department if you develop chest pain, shortness of breath, severe abdominal pain, uncontrolled nausea, vomiting, diarrhea.

## 2022-08-12 DIAGNOSIS — R519 Headache, unspecified: Secondary | ICD-10-CM | POA: Diagnosis not present

## 2022-08-12 DIAGNOSIS — M316 Other giant cell arteritis: Secondary | ICD-10-CM | POA: Diagnosis not present

## 2022-08-12 DIAGNOSIS — M25511 Pain in right shoulder: Secondary | ICD-10-CM | POA: Diagnosis not present

## 2022-09-07 DIAGNOSIS — H43393 Other vitreous opacities, bilateral: Secondary | ICD-10-CM | POA: Diagnosis not present

## 2022-09-07 DIAGNOSIS — H26493 Other secondary cataract, bilateral: Secondary | ICD-10-CM | POA: Diagnosis not present

## 2022-09-22 DIAGNOSIS — H26492 Other secondary cataract, left eye: Secondary | ICD-10-CM | POA: Diagnosis not present

## 2022-10-01 ENCOUNTER — Encounter: Payer: Self-pay | Admitting: Podiatry

## 2022-10-01 ENCOUNTER — Ambulatory Visit: Payer: Medicare Other | Admitting: Podiatry

## 2022-10-01 DIAGNOSIS — B351 Tinea unguium: Secondary | ICD-10-CM | POA: Diagnosis not present

## 2022-10-01 DIAGNOSIS — L819 Disorder of pigmentation, unspecified: Secondary | ICD-10-CM

## 2022-10-01 MED ORDER — AMMONIUM LACTATE 12 % EX CREA: 1.0000 | TOPICAL_CREAM | CUTANEOUS | 0 refills | Status: AC | PRN

## 2022-10-01 MED ORDER — CICLOPIROX 8 % EX SOLN: Freq: Every day | CUTANEOUS | 2 refills | Status: AC

## 2022-10-01 NOTE — Progress Notes (Unsigned)
Subjective:   Patient ID: Lori Sanchez, female   DOB: 82 y.o.   MRN: [id number]   HPI Chief Complaint  Patient presents with   Nail Problem    1st and 3rd toenail left - thick, dark  x months, noticed that her skin has been peeling around the edges as well   New Patient (Initial Visit)   82 year old female presents with above concerns.  She has no pain associate with the nails no swelling redness or drainage.  No recent treatment.   Review of Systems  All other systems reviewed and are negative.  Past Medical History:  Diagnosis Date   Arthritis    GERD (gastroesophageal reflux disease)    Hyperlipemia    Hypertension    Osteoporosis    Shingles    Temporal arteritis (HCC)     Past Surgical History:  Procedure Laterality Date   ABDOMINAL HYSTERECTOMY  1978   BREAST EXCISIONAL BIOPSY Right 2009   benign   CATARACT EXTRACTION W/ INTRAOCULAR LENS IMPLANT Bilateral 07/2020     Current Outpatient Medications:    alendronate (FOSAMAX) 70 MG tablet, TAKE 1 TABLET BY MOUTH  WEEKLY 1/2 HOUR BEFORE THE  FIRST FOOD, BEVERAGE OR  MEDICINE OF THE DAY WITH  PLAIN WATER, Disp: , Rfl:    amLODipine (NORVASC) 5 MG tablet, Take 2 tablets (10 mg total) by mouth daily., Disp: 20 tablet, Rfl: 0   ammonium lactate (AMLACTIN) 12 % cream, Apply 1 Application topically as needed for dry skin., Disp: 385 g, Rfl: 0   Cholecalciferol (D3 PO), Take by mouth., Disp: , Rfl:    ciclopirox (PENLAC) 8 % solution, Apply topically at bedtime. Apply over nail and surrounding skin. Apply daily over previous coat. After seven (7) days, may remove with alcohol and continue cycle., Disp: 6.6 mL, Rfl: 2   ezetimibe (ZETIA) 10 MG tablet, Take 10 mg by mouth daily., Disp: , Rfl:    Iron, Ferrous Sulfate, 325 (65 Fe) MG TABS, Take by mouth 2 (two) times daily., Disp: , Rfl:    losartan (COZAAR) 100 MG tablet, Take 100 mg by mouth daily., Disp: , Rfl:    pantoprazole (PROTONIX) 40 MG tablet, Take 1 tablet by mouth  2 (two) times daily., Disp: , Rfl:    Polyethyl Glycol-Propyl Glycol (SYSTANE FREE OP), Apply to eye., Disp: , Rfl:    prednisoLONE 5 MG TABS tablet, Take by mouth., Disp: , Rfl:    Prenatal MV-Min-FA-Omega-3 (VITAFUSION PRENATAL PO), Take by mouth., Disp: , Rfl:   No Known Allergies        Objective:  Physical Exam  General: AAO x3, NAD  Dermatological: Nails in general but especially the left first and third nails are hypertrophic, dystrophic yellow, brown discoloration.  There is no extension of hyperpigmentation noted today.  Clinically it appears to be more of a fungus.  No edema, erythema or signs of infection.  Vascular: Dorsalis Pedis artery and Posterior Tibial artery pedal pulses are 2/4 bilateral with immedate capillary fill time.  There is no pain with calf compression, swelling, warmth, erythema.   Neruologic: Grossly intact via light touch bilateral.   Musculoskeletal: No significant pain on exam.  Gait: Unassisted, Nonantalgic.       Assessment:   82 year old female onychomycosis/onychodystrophy      Plan:  -Treatment options discussed including all alternatives, risks, and complications -Discussed the etiology of this I do think it is more likely coming from fungus.  However if symptoms persist or worsen  may need to have further biopsy.  Discussed different treatment options.  Prescribed Penlac.  Discussed application instructions, duration of use, success rates.  Return if symptoms worsen or fail to improve.  Vivi Barrack DPM

## 2022-10-06 DIAGNOSIS — H26491 Other secondary cataract, right eye: Secondary | ICD-10-CM | POA: Diagnosis not present

## 2022-10-07 ENCOUNTER — Ambulatory Visit (HOSPITAL_COMMUNITY): Payer: Medicare Other | Attending: Podiatry

## 2022-11-03 DIAGNOSIS — D649 Anemia, unspecified: Secondary | ICD-10-CM | POA: Diagnosis not present

## 2022-11-03 DIAGNOSIS — M0609 Rheumatoid arthritis without rheumatoid factor, multiple sites: Secondary | ICD-10-CM | POA: Diagnosis not present

## 2022-11-03 DIAGNOSIS — M316 Other giant cell arteritis: Secondary | ICD-10-CM | POA: Diagnosis not present

## 2022-11-03 DIAGNOSIS — M549 Dorsalgia, unspecified: Secondary | ICD-10-CM | POA: Diagnosis not present

## 2022-11-03 DIAGNOSIS — R202 Paresthesia of skin: Secondary | ICD-10-CM | POA: Diagnosis not present

## 2022-11-03 DIAGNOSIS — M25529 Pain in unspecified elbow: Secondary | ICD-10-CM | POA: Diagnosis not present

## 2022-11-03 DIAGNOSIS — M810 Age-related osteoporosis without current pathological fracture: Secondary | ICD-10-CM | POA: Diagnosis not present

## 2022-11-03 DIAGNOSIS — M150 Primary generalized (osteo)arthritis: Secondary | ICD-10-CM | POA: Diagnosis not present

## 2022-11-03 DIAGNOSIS — R5383 Other fatigue: Secondary | ICD-10-CM | POA: Diagnosis not present
